# Patient Record
Sex: Female | Born: 1980 | Hispanic: No | Smoking: Former smoker
Health system: Southern US, Community
[De-identification: ages and names within clinical notes are randomized; demographics above are authoritative.]

## PROBLEM LIST (undated history)

## (undated) ENCOUNTER — Inpatient Hospital Stay (HOSPITAL_COMMUNITY): Payer: Self-pay

## (undated) DIAGNOSIS — F17210 Nicotine dependence, cigarettes, uncomplicated: Secondary | ICD-10-CM

## (undated) DIAGNOSIS — Z9889 Other specified postprocedural states: Secondary | ICD-10-CM

## (undated) DIAGNOSIS — F1990 Other psychoactive substance use, unspecified, uncomplicated: Secondary | ICD-10-CM

## (undated) DIAGNOSIS — E119 Type 2 diabetes mellitus without complications: Secondary | ICD-10-CM

## (undated) HISTORY — PX: OTHER SURGICAL HISTORY: SHX169

---
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
## 2002-01-13 ENCOUNTER — Ambulatory Visit (HOSPITAL_COMMUNITY): Admission: RE | Admit: 2002-01-13 | Discharge: 2002-01-13 | Payer: Self-pay | Admitting: *Deleted

## 2002-01-13 ENCOUNTER — Encounter: Payer: Self-pay | Admitting: *Deleted

## 2002-04-05 ENCOUNTER — Observation Stay (HOSPITAL_COMMUNITY): Admission: AD | Admit: 2002-04-05 | Discharge: 2002-04-06 | Payer: Self-pay | Admitting: *Deleted

## 2002-04-12 ENCOUNTER — Ambulatory Visit (HOSPITAL_COMMUNITY): Admission: AD | Admit: 2002-04-12 | Discharge: 2002-04-12 | Payer: Self-pay | Admitting: *Deleted

## 2002-04-18 ENCOUNTER — Inpatient Hospital Stay (HOSPITAL_COMMUNITY): Admission: AD | Admit: 2002-04-18 | Discharge: 2002-04-20 | Payer: Self-pay | Admitting: *Deleted

## 2003-01-03 ENCOUNTER — Emergency Department (HOSPITAL_COMMUNITY): Admission: EM | Admit: 2003-01-03 | Discharge: 2003-01-03 | Payer: Self-pay | Admitting: Internal Medicine

## 2003-01-17 ENCOUNTER — Ambulatory Visit (HOSPITAL_COMMUNITY): Admission: RE | Admit: 2003-01-17 | Discharge: 2003-01-17 | Payer: Self-pay | Admitting: *Deleted

## 2003-04-12 ENCOUNTER — Ambulatory Visit (HOSPITAL_COMMUNITY): Admission: AD | Admit: 2003-04-12 | Discharge: 2003-04-12 | Payer: Self-pay | Admitting: *Deleted

## 2003-04-14 ENCOUNTER — Inpatient Hospital Stay (HOSPITAL_COMMUNITY): Admission: RE | Admit: 2003-04-14 | Discharge: 2003-04-16 | Payer: Self-pay | Admitting: *Deleted

## 2003-04-25 ENCOUNTER — Emergency Department (HOSPITAL_COMMUNITY): Admission: EM | Admit: 2003-04-25 | Discharge: 2003-04-25 | Payer: Self-pay | Admitting: Emergency Medicine

## 2003-07-30 ENCOUNTER — Emergency Department (HOSPITAL_COMMUNITY): Admission: EM | Admit: 2003-07-30 | Discharge: 2003-07-31 | Payer: Self-pay | Admitting: Emergency Medicine

## 2003-08-15 ENCOUNTER — Emergency Department (HOSPITAL_COMMUNITY): Admission: EM | Admit: 2003-08-15 | Discharge: 2003-08-15 | Payer: Self-pay | Admitting: Emergency Medicine

## 2003-09-04 ENCOUNTER — Emergency Department (HOSPITAL_COMMUNITY): Admission: EM | Admit: 2003-09-04 | Discharge: 2003-09-04 | Payer: Self-pay | Admitting: Emergency Medicine

## 2003-10-31 ENCOUNTER — Ambulatory Visit (HOSPITAL_COMMUNITY): Admission: RE | Admit: 2003-10-31 | Discharge: 2003-10-31 | Payer: Self-pay | Admitting: Family Medicine

## 2003-11-08 ENCOUNTER — Ambulatory Visit (HOSPITAL_COMMUNITY): Admission: RE | Admit: 2003-11-08 | Discharge: 2003-11-08 | Payer: Self-pay | Admitting: Family Medicine

## 2004-02-09 ENCOUNTER — Ambulatory Visit (HOSPITAL_COMMUNITY): Admission: RE | Admit: 2004-02-09 | Discharge: 2004-02-09 | Payer: Self-pay | Admitting: Family Medicine

## 2004-03-24 ENCOUNTER — Emergency Department (HOSPITAL_COMMUNITY): Admission: EM | Admit: 2004-03-24 | Discharge: 2004-03-25 | Payer: Self-pay | Admitting: Emergency Medicine

## 2004-05-20 ENCOUNTER — Emergency Department (HOSPITAL_COMMUNITY): Admission: EM | Admit: 2004-05-20 | Discharge: 2004-05-20 | Payer: Self-pay | Admitting: Emergency Medicine

## 2004-11-19 ENCOUNTER — Emergency Department (HOSPITAL_COMMUNITY): Admission: EM | Admit: 2004-11-19 | Discharge: 2004-11-20 | Payer: Self-pay | Admitting: Emergency Medicine

## 2008-12-04 ENCOUNTER — Emergency Department (HOSPITAL_COMMUNITY): Admission: EM | Admit: 2008-12-04 | Discharge: 2008-12-04 | Payer: Self-pay | Admitting: Emergency Medicine

## 2009-05-15 ENCOUNTER — Emergency Department (HOSPITAL_COMMUNITY): Admission: EM | Admit: 2009-05-15 | Discharge: 2009-05-15 | Payer: Self-pay | Admitting: Emergency Medicine

## 2009-06-19 ENCOUNTER — Emergency Department (HOSPITAL_BASED_OUTPATIENT_CLINIC_OR_DEPARTMENT_OTHER): Admission: EM | Admit: 2009-06-19 | Discharge: 2009-06-19 | Payer: Self-pay | Admitting: Emergency Medicine

## 2009-10-03 ENCOUNTER — Emergency Department (HOSPITAL_COMMUNITY): Admission: EM | Admit: 2009-10-03 | Discharge: 2009-10-03 | Payer: Self-pay | Admitting: Emergency Medicine

## 2010-03-21 LAB — POCT URINALYSIS DIPSTICK
Bilirubin Urine: NEGATIVE
Glucose, UA: NEGATIVE mg/dL
Nitrite: NEGATIVE
Urobilinogen, UA: 0.2 mg/dL (ref 0.0–1.0)

## 2010-03-21 LAB — POCT PREGNANCY, URINE: Preg Test, Ur: NEGATIVE

## 2010-03-25 ENCOUNTER — Emergency Department (HOSPITAL_BASED_OUTPATIENT_CLINIC_OR_DEPARTMENT_OTHER)
Admission: EM | Admit: 2010-03-25 | Discharge: 2010-03-25 | Disposition: A | Discharge status: Home / Self Care | Payer: Self-pay | Attending: Emergency Medicine | Admitting: Emergency Medicine

## 2010-03-25 DIAGNOSIS — R51 Headache: Secondary | ICD-10-CM | POA: Insufficient documentation

## 2010-03-25 DIAGNOSIS — J020 Streptococcal pharyngitis: Secondary | ICD-10-CM | POA: Insufficient documentation

## 2010-03-25 LAB — URINALYSIS, ROUTINE W REFLEX MICROSCOPIC
Bilirubin Urine: NEGATIVE
Glucose, UA: NEGATIVE mg/dL
Hgb urine dipstick: NEGATIVE
Ketones, ur: NEGATIVE mg/dL
Nitrite: NEGATIVE
Protein, ur: NEGATIVE mg/dL
Specific Gravity, Urine: 1.03 (ref 1.005–1.030)
Urobilinogen, UA: 1 mg/dL (ref 0.0–1.0)
pH: 6 (ref 5.0–8.0)

## 2010-03-25 LAB — PREGNANCY, URINE: Preg Test, Ur: NEGATIVE

## 2010-03-26 LAB — POCT I-STAT, CHEM 8
BUN: 5 mg/dL — ABNORMAL LOW (ref 6–23)
Calcium, Ion: 1.15 mmol/L (ref 1.12–1.32)
Creatinine, Ser: 0.5 mg/dL (ref 0.4–1.2)
TCO2: 22 mmol/L (ref 0–100)

## 2010-03-26 LAB — CBC
Hemoglobin: 12.9 g/dL (ref 12.0–15.0)
MCHC: 34.9 g/dL (ref 30.0–36.0)
MCV: 92.3 fL (ref 78.0–100.0)
RBC: 4 MIL/uL (ref 3.87–5.11)
RDW: 12.8 % (ref 11.5–15.5)

## 2010-03-26 LAB — URINALYSIS, ROUTINE W REFLEX MICROSCOPIC
Bilirubin Urine: NEGATIVE
Hgb urine dipstick: NEGATIVE
Ketones, ur: NEGATIVE mg/dL
Specific Gravity, Urine: 1.017 (ref 1.005–1.030)
Urobilinogen, UA: 0.2 mg/dL (ref 0.0–1.0)

## 2010-03-26 LAB — DIFFERENTIAL
Basophils Relative: 1 % (ref 0–1)
Eosinophils Absolute: 0.1 10*3/uL (ref 0.0–0.7)
Monocytes Absolute: 0.4 10*3/uL (ref 0.1–1.0)
Monocytes Relative: 5 % (ref 3–12)
Neutrophils Relative %: 68 % (ref 43–77)

## 2010-04-10 LAB — DIFFERENTIAL
Eosinophils Absolute: 0.1 10*3/uL (ref 0.0–0.7)
Eosinophils Relative: 1 % (ref 0–5)
Lymphs Abs: 1.7 10*3/uL (ref 0.7–4.0)
Monocytes Relative: 8 % (ref 3–12)

## 2010-04-10 LAB — CBC
HCT: 35.3 % — ABNORMAL LOW (ref 36.0–46.0)
Hemoglobin: 12.1 g/dL (ref 12.0–15.0)
MCV: 91.9 fL (ref 78.0–100.0)
RBC: 3.85 MIL/uL — ABNORMAL LOW (ref 3.87–5.11)
WBC: 10.9 10*3/uL — ABNORMAL HIGH (ref 4.0–10.5)

## 2010-04-10 LAB — HEPATIC FUNCTION PANEL
Albumin: 3.3 g/dL — ABNORMAL LOW (ref 3.5–5.2)
Alkaline Phosphatase: 59 U/L (ref 39–117)
Indirect Bilirubin: 0 mg/dL — ABNORMAL LOW (ref 0.3–0.9)
Total Bilirubin: 0.2 mg/dL — ABNORMAL LOW (ref 0.3–1.2)

## 2010-05-24 NOTE — Discharge Summary (Signed)
   NAME:  [name], [name]                       ACCOUNT NO.:  [id number]   MEDICAL RECORD NO.:  [id number]                   PATIENT TYPE:  INP   LOCATION:  A418                                 FACILITY:  [facility]   PHYSICIAN:  Roylene Reason. Lisette Grinder, M.D.             DATE OF BIRTH:  [birth year]   DATE OF ADMISSION:  10/03/2001  DATE OF DISCHARGE:  10/05/2001                                 DISCHARGE SUMMARY   DIAGNOSIS:  Intrauterine pregnancy at 11 weeks with pyelonephritis.   DISPOSITION AT TIME OF DISCHARGE:  The patient is advised to be seen in the  office in one week's time.   DISCHARGE MEDICATIONS:  1. Keflex 500 mg p.o. b.i.d. x7 days.  2. Mepergan Fortis one to two q.6h. p.r.n. flank pain #20 with no refill.   DISCHARGE INSTRUCTIONS:  The patient is given instructions to contact us  immediately should nausea and vomiting occur such that she is unable to  tolerate her p.o. antibiotics.  Likewise, she is advised that she should  remain afebrile post hospitalization.   HOSPITAL COURSE:  I evaluated the patient on October 04, 2001 and agreed  with the diagnosis of pyelonephritis.  The patient was treated at that time  with IV fluid resuscitative therapy as well as Rocephin 1 g IV q.12h.  The  patient did well on this regimen.  She had a Tmax during the hospitalization  of 100.5.  She was able to ambulate and void without difficulty, tolerated a  regular general diet, and most importantly, was able to tolerate p.o.  narcotics for pain relief as well as p.o. antibiotics.  The patient did well  during this hospitalization such that she was discharged to home on  October 05, 2001.  Pertinently at time of discharge she continues to  complain of some back pain; however, the patient's complaints at this time  are not that of flank pain, rather of sacroiliac joint pain bilaterally.  This should respond to improved posture as well as short-time use of pain  medication.  Thus the patient  was discharged home on October 05, 2001.  She will continue to follow up and been seen for prenatal care.   OTHER PROCEDURES:  An OB ultrasound was performed in the department of  radiology which confirmed a viable pregnancy and agreed with our prior  gestational age calculations.                                               Donald P. Lisette Grinder, M.D.    DPC/MEDQ  D:  10/06/2001  T:  10/07/2001  Job:  [id number]

## 2010-05-24 NOTE — H&P (Signed)
NAME:  [name], [name]                       ACCOUNT NO.:  [id number]   MEDICAL RECORD NO.:  [id number]                   PATIENT TYPE:  INP   LOCATION:  LDR1                                 FACILITY:  [facility]   PHYSICIAN:  Langley Gauss, M.D.                DATE OF BIRTH:  02/28/1980   DATE OF ADMISSION:  04/18/2002  DATE OF DISCHARGE:                                HISTORY & PHYSICAL   HISTORY OF PRESENT ILLNESS:  This 30 year old gravida 2, para 1, 38+ weeks  gestation, is admitted for induction of labor secondary to psychosocial  factors.  The patient's prenatal course has been complicated only by  admission for a pyelonephritis from 09/28 to 10/05/2001.  The patient,  thereafter, was to be maintained on Macrodantin suppressive therapy 100 mg  p.o. q.h.s.; however, she was noncompliant with this, stating that she  preferred not to take medication.  Fortunately she did not have any  recurrent UTIs or kidney infections during the remainder of the pregnancy.  The patient has had normal laboratory studies, glucose tolerance test is  normal at 121.  She has had normal ultrasounds which have documented  adequate fetal growth and normal anatomic survey.   ALLERGIES:  She has no known drug allergies.   CURRENT MEDICATIONS:  Prenatal vitamins.   PAST OBSTETRICAL HISTORY:  11/04/1998 a vaginal delivery at [redacted] weeks  gestation, meconium stained amniotic fluid an 8 pound 10.4 ounce female  infant.   SOCIAL HISTORY:  The patient was smoking prior to the pregnancy, planned on  quitting during the pregnancy.  She is unemployed.  The father of the infant  is named Laurette Schimke who is self-employed.   PHYSICAL EXAMINATION:  GENERAL:  No acute distress.  VITAL SIGNS:  Height 5 feet 3 inches.  Prepregnancy weight 160, most recent  weight 176.  Blood pressure 106/64, pulse of 80, and respiratory rate is 20.  HEENT:  Negative.  NECK:  No adenopathy.  Neck is supple.  Thyroid is not palpable.  LUNGS:  Clear.  CARDIOVASCULAR:  Regular rate and rhythm.  ABDOMEN:  The abdomen is soft and nontender.  No surgical scars are  identified.  She is vertex presentation by Leopold's maneuver.  EXTREMITIES:  Extremities noted to be normal with only 1+ pretibial edema.  PELVIC:  Normal external genitalia.  No lesions or ulcerations identified.  Cervix, digital examination:  Cervix is 2 cm dilated 70% effaced vertex at a  minus 1 station easily palpable.  External fetal monitoring reveals a  reassuring fetal heart rate.  No uterine contractions are identified  initially.   PLAN:  The patient admitted, at this time, for induction of labor.  Initially Pitocin induction is initiated upon reach an adequate functional  labor pattern amniotomy was performed with findings of clear amniotic fluid.  A fetal scalp electrode placed which documents a reassuring fetal heart  rate.  With continuing strength and  duration of uterine  contractions the patient requests an epidural which is to be placed for  analgesic purposes. Notably the patient has had epidural placed during her  previous labor and delivery at which time she did very well with an  excellent bilateral block.                                               Langley Gauss, M.D.    DC/MEDQ  D:  04/18/2002  T:  04/18/2002  Job:  [id number]

## 2010-05-24 NOTE — Op Note (Signed)
NAME:  [name], [name]                       ACCOUNT NO.:  [id number]   MEDICAL RECORD NO.:  [id number]                   PATIENT TYPE:  INP   LOCATION:  LDR1                                 FACILITY:  [facility]   PHYSICIAN:  Langley Gauss, M.D.                DATE OF BIRTH:  01-22-1980   DATE OF PROCEDURE:  04/18/2002  DATE OF DISCHARGE:                                 OPERATIVE REPORT   PREOPERATIVE DIAGNOSES:  A 38 week intrauterine pregnancy for induction.   POSTOPERATIVE DIAGNOSES:  A 38 week intrauterine pregnancy for induction.   PROCEDURE:  1. Placement of epidural.  2. Spontaneous cystovaginal delivery of 6 pound, 14 ounce female infant.  3. Midline episiotomy repair, delivery by Dr. Roylene Reason. Lisette Grinder.   ESTIMATED BLOOD LOSS:  Less than 500 mL.   FINDINGS:  Nuchal cord x1 without compression, reduced at time of delivery.   SPECIMENS:  Arterial cord gas and cord blood to pathology.   LABORATORY DATA:  Placenta was examined and noted to be apparently intact  with a three vessel umbilical cord.   SUMMARY:  The patient had an epidural placed in the afternoon at which time  she was examined thereafter and noted to be 6 cm. The patient had reached 8  cm dilatation with a reassuring fetal heart rate at which time she began  having significant pain associated with uterine contractions thus she was  treated with a bolus of 10 mL of 2% lidocaine. This provided minimal relief  and was about one hour duration after which time the patient became  completely dilated. She was on the labor bed with her legs flexed at the hip  at which time she pushed well and dissented the vertex to the perineal  floor. A small midline episiotomy was performed with distention of the  perineum and infant then delivered in a direct OA position. Mouth and nares  bulb suctioned of clear amniotic fluid. A nuchal cord x1 was reduced.  Spontaneous rotation occurred to a left anterior shoulder position,  gentle  abdominal traction combined with expulsive efforts resulted in delivery of  the remainder of the infant without difficulty, umbilical cord is milked  towards the infant. The cord was doubly clamped and cut and infant is handed  to the nursing staff for immediate assessment. Arterial cord gas and cord  blood then obtained. Gentle traction of the umbilical cord results in  separation which upon examination appears to be an intact placenta with  associated three vessel umbilical cord. Examination of the perineum reveals  no extensions of the episiotomy. The episiotomy was repaired utilizing 2-0  Vicryl in deep interrupted fashion in the superficial, transverse and  perineal muscle followed by #0 Chromic in a running locked fashion on the  vaginal mucosa, followed by two layer closure of #0 Chromic in the perineal  body.  The patient tolerated the delivery very well. She was  taken out of dorsal  lithotomy position and rolled to her side at which time the epidural  catheter was removed with the blue tip noted to be intact. Both mother and  infant doing well following delivery.                                                Langley Gauss, M.D.    DC/MEDQ  D:  04/18/2002  T:  04/18/2002  Job:  [id number]

## 2010-05-24 NOTE — Op Note (Signed)
   NAME:  [name], [name]                       ACCOUNT NO.:  [id number]   MEDICAL RECORD NO.:  [id number]                   PATIENT TYPE:  INP   LOCATION:  LDR1                                 FACILITY:  [facility]   PHYSICIAN:  Langley Gauss, M.D.                DATE OF BIRTH:  1980/11/29   DATE OF PROCEDURE:  04/18/2002  DATE OF DISCHARGE:                                 OPERATIVE REPORT   PROCEDURE:  Placement of continuous lumbar epidural analgesia at the L3-L4  interspace.   SURGEON:  Langley Gauss, M.D.   COMPLICATIONS:  None.   DESCRIPTION OF PROCEDURE:  Appropriate informed consent was obtained.  The  patient was placed in the seated position.  Bony landmarks were identified.  The L3-L4 interspace was chosen.  The patient's back was thoroughly prepped  and draped in the usual manner.  Lidocaine 5 cc 1% plain injected at the  midline of the L3-L4 interspace to raise a small skin wheal.  The 17-gauge  Tuohy Schliff needle was then utilized with loss-of-resistance and an air  filled glass syringe to identify entry into the epidural space on the first  attempt without difficulty.  Initial test dose of 5 cc 1.5% lidocaine plus  epinephrine injected through the epidural needle.  No signs of CSF or  intravascular injection obtained.  The epidural catheter was then inserted  through the upper 5 cm.  Epidural needle was removed.  Aspiration test was  negative.  Second test dose of 2 cc 1.5% lidocaine plus epinephrine injected  through the epidural catheter. Again no signs of CSF or intravascular  injection obtained.  The catheter was secured in place.  Instruments are  removed.  The patient is connected to the infusion pump containing our  standard mixture.  She will be treated with a bolus of 10 cc followed by  continuous infusion rate of 14 cc per hour.  Following completion of the  procedure, the patient does have evidence of a bilateral setting up epidural  block.  She was thus  connected to the infusion pump.  Examination reveals  cervix to be 6 cm dilated, completely effaced with a vertex at 0 station.  Contractions continue every 3-6 minutes.                                               Langley Gauss, M.D.    DC/MEDQ  D:  04/18/2002  T:  04/18/2002  Job:  [id number]   cc:   [facility]

## 2010-05-24 NOTE — Discharge Summary (Signed)
NAMECORALEE, [name] NO.:  [id number]   MEDICAL RECORD NO.:  [id number]                  PATIENT TYPE:  PEMS   LOCATION:                                       FACILITY:   PHYSICIAN:  Langley Gauss, M.D.                DATE OF BIRTH:   DATE OF ADMISSION:  DATE OF DISCHARGE:                                 DISCHARGE SUMMARY   EMERGENCY ROOM VISIT:  The patient is a 30 year old, 2 weeks postpartum who  comes in the emergency room complaining of passage of a large clot about  2030 this p.m.  The patient states that she has had light vaginal bleeding  sine delivery with passage of very small clots. She has had cramping which  has been well-controlled with p.o. Tylox and p.o. Motrin.  However, she ran  out of these and took her last one Sunday p.m.  Since that point in time she  has had increased cramping and passage of that clot.  Following passage of  that clot, the patient states that she continued to have some bright red  vaginal bleeding for which she came to the emergency room.  However, since  that point in time, she has had markedly decreased cramping and markedly  decreased bleeding.  She denies any additional passage of clots.  The  patient denies any fever although she has not taken her temperature at home.  The labor and delivery as well as immediate postpartum course in the  hospital were uncomplicated.  She is bottle feeding at this time.   PAST MEDICAL HISTORY:  Two prior vaginal deliveries.   ALLERGIES:  No known drug allergies.   CURRENT MEDICATIONS:  Motrin p.o.   PHYSICAL EXAMINATION:  GENERAL:  In no acute distress.  VITAL SIGNS:  99.1, 107/63, 89, respiratory rate is 18.  ABDOMEN:  Soft, nontender.  No masses are appreciated.  PELVIC EXAMINATION:  Normal external genitalia no vaginal bleeding is noted  on the external genitalia.  A sterile speculum examination is performed.  A  very light amount of bleeding is noted from the  endocervical os.  No  cervical lesions are identified.  No clots are identified in the vaginal  vault.  The cervix is noted to be dilated sufficiently enough to allow  passage of a large Q-tip.  A ring forceps is then gently introduced through  the endocervical os.  No additional clots or products of conception are  obtained.  This is poorly tolerated the patient thus complete exploration of  the uterine cavity is not possible.  Bimanual examination reveals a soft 8-  week size uterus mildly tender.  No adnexal masses.  A CBC with differential  is currently pending.   ASSESSMENT:  Mild endometritis, postpartum.  Passage of one clot, now with  subinvolution of the uterus.   PLAN:  Treat the patient as an outpatient.  She is advised that  she must  follow up in the office the following day for performance of an ultrasound  to evaluate for retained products, in addition we will be able to evaluate  her bleeding.  She is treated in the emergency room with Methergine 0.2 mg  IM, doxycycline 100 mg p.o. x1, Rocephin 250 mg IM x1.  In addition she is  given #6 of Tylox to take with her as outpatient treatment.     ___________________________________________                                         Langley Gauss, M.D.   DC/MEDQ  D:  04/25/2003  T:  04/26/2003  Job:  [id number]

## 2010-05-24 NOTE — Op Note (Signed)
NAME:  [name], [name]                       ACCOUNT NO.:  [id number]   MEDICAL RECORD NO.:  [id number]                   PATIENT TYPE:  INP   LOCATION:  A410                                 FACILITY:  [facility]   PHYSICIAN:  Langley Gauss, M.D.                DATE OF BIRTH:  03/08/1980   DATE OF PROCEDURE:  DATE OF DISCHARGE:                                 OPERATIVE REPORT   PROCEDURE:  Placement of continuous lumbar epidural analgesia at the L2-L3  interspace performed by Dr. Roylene Reason. Lisette Grinder.   COMPLICATIONS:  None.   DESCRIPTION OF PROCEDURE:  An appropriate and informed consent is obtained.  Continuous electronic fetal monitoring is performed.  The patient was placed  in the seated position at which time bony landmarks were identified.  The L2-  L3 interspace was chosen.  The patient's back is sterilely prepped and  draped utilizing the epidural tray; 3 cc of 1% lidocaine plain injected at  the midline of the L2-L3 interspace to raise this site to raise a small skin  wheal.   The 17-gauge Touhy-Schliff needle was then utilized with loss of resistance  in air-filled glass syringe to identify entry into the epidural space on the  first attempt without difficulty.  Initial test dose of 5 cc of 1.5%  lidocaine plus epinephrine injected through the epidural needle.  No signs  of CSF or intravascular injection obtained.  Thus, the epidural catheter was  inserted to a depth of 35 cm.  Epidural needle is removed.  Aspiration test  is negative.  Second test dose of 2 cc of 1.5% lidocaine plus epinephrine  injected through epidural catheter; again, no signs of CSF or intravascular  injection obtained.   The patient is having some tingling consistent with a proper setting up  epidural block.  Thus, she is connected to the infusion pump containing the  standard mixture.  She will be treated with a bolus of 10 cc followed by  continuous infusion rate of 14 cc/hour.  Upon completion of the  procedure  the patient is returned to the left lateral decubitus position.  The cervix  is examined.  She is now noted to be 5 cm dilated. She continues to have a  reassuring fetal heart rate.  She does have evidence of an obvious  adequately setting up bilateral block.      ___________________________________________                                            Langley Gauss, M.D.   DC/MEDQ  D:  04/14/2003  T:  04/15/2003  Job:  [id number]

## 2010-05-24 NOTE — H&P (Signed)
NAME:  [name], [name]                       ACCOUNT NO.:  [id number]   MEDICAL RECORD NO.:  [id number]                   PATIENT TYPE:  INP   LOCATION:  A410                                 FACILITY:  [facility]   PHYSICIAN:  Langley Gauss, M.D.                DATE OF BIRTH:  [birth year]   DATE OF ADMISSION:  04/14/2003  DATE OF DISCHARGE:                                HISTORY & PHYSICAL   HISTORY OF PRESENT ILLNESS:  The patient is a 30 year old gravida 3, para 2,  two prior vaginal deliveries at 37+ weeks gestation who presents in the  early stages of active labor.  The patient's prenatal course had been  uncomplicated.  She, however, does have an unknown group B strep status as  it was performed in the office on April 13, 2003, with results not available.  The patient has had serial ultrasounds which have documented a normal  anatomic survey and adequate fetal growth.   PAST OB HISTORY:  She has two prior vaginal deliveries without  complications.   CURRENT MEDICATIONS:  Hydrocodone on a p.r.n. basis.   ALLERGIES:  No known drug allergies.   PHYSICAL EXAMINATION:  GENERAL:  A pleasant female, fluent with [place] as  well as [place].  VITAL SIGNS:  Temperature 97, respiratory rate 20, pulse 86, blood pressure  117/68.  Height 5 feet, 5 inches.  Current weight 166.  HEENT:  Negative.  No adenopathy.  NECK:  Supple.  Thyroid is nonpalpable.  LUNGS:  Clear.  CARDIOVASCULAR:  Exam is regular rate and rhythm.  ABDOMEN:  Soft, nontender.  No surgical scars were identified.  She has  vertex presentation, by Thayer Ohm maneuver.  Fundal height is measured at 34  cm.  EXTREMITIES:  Noted to be normal.  PELVIC:  Normal external genitalia.  No lesions or ulcerations identified.  No vaginal bleeding, no leakage of fluid.  Cervical examination, 4 cm  dilated, 70% effaced, posterior position, but the vertex is at 0 station.  External fetal monitor reveals a reassuring fetal heart rate.   Initial  uterine activity q.3-5 minutes on external fetal monitor.  The patient did  have decrease in uterine activity to q.5-10 minutes after receiving IV  narcotics for pain relief.   ASSESSMENT/PLAN:  1. The patient with unknown group B strep status.  We will thus treat with     IV ampicillin during the course of labor.  Subsequent plans include     proceeding with amniotomy.  The patient to be managed expectantly.     Follow     amniotomy.  If active labor does not ensue, we will proceed with Pitocin     induction or augmentation as clinically indicated.  The patient is     planning on having epidural analgesic placed during this labor.  She has     had epidurals placed with prior deliveries.    ___________________________________________  Langley Gauss, M.D.   DC/MEDQ  D:  04/14/2003  T:  04/14/2003  Job:  [id number]

## 2010-05-24 NOTE — Discharge Summary (Signed)
NAME:  [name], [name]                       ACCOUNT NO.:  [id number]   MEDICAL RECORD NO.:  [id number]                   PATIENT TYPE:  INP   LOCATION:  A410                                 FACILITY:  [facility]   PHYSICIAN:  Langley Gauss, M.D.                DATE OF BIRTH:  Oct 05, 1980   DATE OF ADMISSION:  04/14/2003  DATE OF DISCHARGE:  04/16/2003                                 DISCHARGE SUMMARY   DISCHARGE DIAGNOSIS:  See previous dictations.   PROCEDURE:  Vaginal delivery performed utilizing epidural analgesic.   DISPOSITION:  The patient is bottle-feeding at the time of discharge.   FOLLOW UP:  She will have followup in the office in four weeks' time.   SPECIAL INSTRUCTIONS:  She is given a copy of the standard discharge  instructions.   DISCHARGE MEDICATIONS:  1. HCTZ 25 mg p.o. q.d.  2. Tylox 30 mg without refill.  3. Ambien 10 mg p.o. q.h.s., #10 with no refill.   LABORATORY DATA AND X-RAY FINDINGS:  Admission hemoglobin and hematocrit  10.8 and 390.8 with a white count of 6.6.  Post partum day #1, hemoglobin  and hematocrit 9.4 and 27.7 with white count of 6.3.  ER/PR is noted to be  nonreactive.  GBS status is unknown as GBS culture had been performed in the  office on April 13, 2003, and the results not yet made available.  Thus, the  patient was treated empirically during the course of labor according to the  [facility] protocol.   HOSPITAL COURSE:  See previous dictations.  The patient was admitted on  April 14, 2003, actually presenting in labor.  The patient initially  requested IV narcotics for pain relief.  However, with increasing discomfort  associated with uterine contractions, she requested epidural as had been  expected.  Epidural was placed without complications or difficulty.  Epidural actually functioned very well given an excellent bilateral block.  With onset of the adequate block, the patient actually progressed rapidly  along the labor curve to  complete dilatation.  She delivered without  complications.  Post partum, the patient did well.  She had no postpartum  complications and bonded well with the infant.  She had no excessive  bleeding.  Thus, the mother and infant were discharged home on April 16, 2003.  The patient's circumcision had been performed the p.m. of April 15, 2003, by Dr. Langley Gauss.  See separate dictation.     ___________________________________________                                         Langley Gauss, M.D.   DC/MEDQ  D:  04/17/2003  T:  04/17/2003  Job:  [id number]

## 2010-05-24 NOTE — H&P (Signed)
NAME:  [name], [name]                       ACCOUNT NO.:  [id number]   MEDICAL RECORD NO.:  [id number]                   PATIENT TYPE:  INP   LOCATION:  A418                                 FACILITY:  [facility]   PHYSICIAN:  Roylene Reason. Lisette Grinder, M.D.             DATE OF BIRTH:  02-05-1980   DATE OF ADMISSION:  10/03/2001  DATE OF DISCHARGE:  10/05/2001                                HISTORY & PHYSICAL   HISTORY OF PRESENT ILLNESS:  A 30 year old gravida 2, para 1 at about [redacted]  weeks gestation who actually presented to [facility] in  the emergency room the late p.m. of October 03, 2001, at which time verbal  orders were given; however, the patient was not evaluated and admitted until  the a.m. of October 04, 2001 by myself.  The patient presented to the  emergency room complaining of flank pain in the right for several days'  duration.  In addition, she has had some nausea, as well as some sweats.  She likewise has noticed some urinary frequency and dysuria.  The patient  evaluated in the emergency room on October 03, 2001 by the emergency room  physician.  Thereafter, Dr. Duane Lope was contacted, according to cross-  coverage arrangement.  He gave verbal orders for the patient to be admitted  but I did not see and evaluate the patient until the a.m. of October 04, 2001.   PRENATAL COURSE:  The patient had an initial prenatal visit on September 14, 2001, at which time she complained only of breast tenderness, no spotting or  cramping, and some nausea.   CURRENT MEDICATIONS:  Prenatal vitamins.   ALLERGIES:  No known drug allergies.   OBSTETRICAL HISTORY:  November 04, 1998:  Vaginal delivery, 8 pound 10.4  ounce infant utilizing epidural.  The patient with no other medical or  surgical history.   PHYSICAL EXAMINATION:  GENERAL:  A [place] female in mild distress.  VITAL SIGNS:  T-max 100.5, blood pressure 115/70, pulse rate of 80,  respiratory rate is  20.  HEENT:  Negative.  No adenopathy.  NECK:  Supple.  ABDOMEN:  A gravid uterus consistent with [redacted] weeks gestation.  BREASTS:  Without masses.  EXTREMITIES:  Noted to be normal.  PELVIC:  Normal external genitalia.  No bleeding or leakage of fluid is  identified.  There is noted to be a marked right CVAT on examination.   LABORATORY DATA:  Urine culture is pertinent for the presence of white  cells, as well as bacteria.  Hemoglobin 11.6, hematocrit 33.6, white count  7.3.    ASSESSMENT:  An 11-week intrauterine pregnancy with right flank pain, a low-  grade temperature, and urinary tract symptoms.  Likewise, laboratory studies  support a diagnosis of pyelonephritis, thus the patient is admitted by  myself on October 04, 2001 for a fluid resuscitative therapy, as well as  intravenous  antibiotics.  Obstetric ultrasound to be performed on this date  to confirm gestational age and assess viability of the infant.                                               Donald P. Lisette Grinder, M.D.    DPC/MEDQ  D:  10/06/2001  T:  10/06/2001  Job:  [id number]

## 2010-05-24 NOTE — Op Note (Signed)
NAME:  [name], [name]                       ACCOUNT NO.:  [id number]   MEDICAL RECORD NO.:  [id number]                   PATIENT TYPE:  INP   LOCATION:  A410                                 FACILITY:  [facility]   PHYSICIAN:  Langley Gauss, M.D.                DATE OF BIRTH:  1980/03/21   DATE OF PROCEDURE:  DATE OF DISCHARGE:                                 OPERATIVE REPORT   DELIVERY NOTE   DATE OF DELIVERY:  April 14, 2003   DIAGNOSES:  1. A 37+ weeks intrauterine pregnancy presenting in labor.  2. Unknown group B strep status.   PROCEDURES:  1. Placement of continuous lumbar epidural analgesia.  2. Spontaneous assisted vaginal delivery of a 6 pound 11 ounce female infant     delivered over an intact perineum.  Delivery performed by Dr. Roylene Reason.     Lisette Grinder.   ESTIMATED BLOOD LOSS:  Less than 500 cc.   COMPLICATIONS:  None.   SPECIMENS:  1. Arterial cord gas and cord blood to laboratory.  2. Placenta examined and noted to be apparently intake with a 3-vessel     umbilical cord.   SUMMARY:  A 30 year old patient at 37+ weeks intrauterine pregnancy who  presented in the early a.m. in the early stages of labor.  She was  complaining of contractions q.3-5 minutes throughout the night. No  complaints of vaginal discharge or no vaginal bleeding.   The patient was admitted and initially was treated with IV Nubain and  Phenergan for pain relief.  However, with onset of active labor the patient  requested epidural analgesic.  Epidural was placed without difficulty and  functioned well throughout the remainder of the labor course.   The patient progressed rapidly along the labor curve to complete dilatation.  At complete dilatation additional analgesic was required.  The patient did  received 5 cc of 2% lidocaine plain to facilitate the epidural block.   The patient was placed in the dorsal lithotomy position and prepped and  draped in the usual sterile manner.  Foley catheter  was removed.  The  patient pushed well during a short second stage of labor with descent of the  vertex to the perineal floor.  The perineum was milked and gently stretched  during the second stage.  The infant, then delivered, in a direct OA  position over the intact perineum.  The mouth and nares were bulb suctioned  of clear amniotic fluid.  Spontaneous rotation then occurred to a left  anterior shoulder position.  Gentle downward traction combined with  expulsive efforts resulted in delivery of the remainder of the infant  without difficulty.  A spontaneous cry is noted.  The umbilical cord is  milked towards the infant.  The cord is doubly clamped, and cut; and infant  is taken to the nursery staff for immediate assessment.  Arterial cord gases  and cord blood are then  obtained.   Gentle traction on the umbilical cord results in separation which, upon  examination, appears to be an intact placenta with associated 3-vessel  umbilical cord.  Fundal massage is required to achieve adequate uterine tone  and minimize bleeding.  Following no complications were noted to have  occurred.  The patient is thus taken out of dorsal lithotomy position and  rolled to her side, at which time the epidural catheter is removed with the  blue tip noted to be intact.      ___________________________________________                                            Langley Gauss, M.D.   DC/MEDQ  D:  04/14/2003  T:  04/15/2003  Job:  [id number]

## 2010-05-24 NOTE — Discharge Summary (Signed)
   NAME:  [name], [name]                       ACCOUNT NO.:  [id number]   MEDICAL RECORD NO.:  [id number]                   PATIENT TYPE:  INP   LOCATION:  A427                                 FACILITY:  [facility]   PHYSICIAN:  Langley Gauss, M.D.                DATE OF BIRTH:  07-Jan-1980   DATE OF ADMISSION:  04/18/2002  DATE OF DISCHARGE:  04/20/2002                                 DISCHARGE SUMMARY   DIAGNOSES:  A 38-week intrauterine pregnancy for induction of labor.   PROCEDURE:  1. Placement of epidural.  2. Spontaneous assisted vaginal delivery 6 pound 14 ounce female infant.  3. Midline episiotomy repair.   DISPOSITION:  Given a copy of standardized discharge instructions.  Follow  up in the office in four weeks' time.  She is bottle feeding at time of  discharge.  At postpartum follow-up she would like to initiate the NuvaRing  for contraceptive purposes.   LABORATORIES:  Admission hemoglobin and hematocrit 11.3/33.5 with a white  count of 8.0.  Postpartum day number one 10.5/30.8 with a white count of  9.4.  O+ blood type.   HOSPITAL COURSE:  See previous dictations.  The patient delivered in a well  controlled manner with an excellent epidural block.  Postpartum patient did  well.  She had no postpartum complications.  Bonded well with the infant.  Had good family support.  Was thus discharged to home on postpartum day  number one and one-half.   DISCHARGE MEDICATIONS:  1. Tylenol No.3 for relief associated with the suturing.  2. To take Advil or Motrin on a p.r.n. basis.                                               Langley Gauss, M.D.    DC/MEDQ  D:  04/20/2002  T:  04/20/2002  Job:  [id number]

## 2010-05-24 NOTE — Discharge Summary (Signed)
NAME:  [name], [name]                        ACCOUNT NO.:  [id number]   MEDICAL RECORD NO.:  [id number]                   PATIENT TYPE:  INP   LOCATION:  A410                                 FACILITY:  [facility]   PHYSICIAN:  Langley Gauss, M.D.                DATE OF BIRTH:  07-21-1980   DATE OF ADMISSION:  04/14/2003  DATE OF DISCHARGE:  04/16/2003                                 DISCHARGE SUMMARY   PROCEDURES:  Infant circumcision utilizing Mogen clamp, performed by Dr.  Roylene Reason. Lisette Grinder, without complications per parent's request.   SUMMARY:  The infant was taken to the nursery, placed on the circumcision  table under restraints, prepped and draped in the usual sterile manner.  Dorsal penile nerve block was then placed by utilizing 0.2 cc 1% lidocaine  plain at the dorsal surface at the base of the penis.  The ureteral meatus  was then identified.  Foreskin was grasped at 3 and 9 o'clock with straight  hemostat clamps.  This then allowed me to dissect in the avascular plane  between 9 and 3 o'clock and free up the foreskin.  The tip of the head of  the penis was then visualized.  A straight hemostat clamp was used to grasp  and crush the foreskin at 12 o'clock along the lying access of the penis.  This was then snipped with a scissors which allowed better visualization of  the tip of the head of the penis.  Under direct visualization, the tip of  the head of his penis was identified.  A straight hemostat was used to clamp  redundant foreskin just distal to this.  The Mogen clamp was then placed  just proximal to this.  Sharp excision was performed between the two.  Mogen  clamp was then removed.  Foreskin was retracted proximally over the head of  the penis resulting in the excellent cosmetic result.  Single piece of  Surgicel woven cloth was then placed circumferentially at the operative  site.  Infant tolerated the procedure very well and was returned to the  mother in excellent  condition.     ___________________________________________                                         Langley Gauss, M.D.   DC/MEDQ  D:  04/17/2003  T:  04/17/2003  Job:  [id number]

## 2011-12-08 ENCOUNTER — Encounter (HOSPITAL_COMMUNITY): Payer: Self-pay | Admitting: Nurse Practitioner

## 2011-12-08 ENCOUNTER — Emergency Department (HOSPITAL_COMMUNITY)
Admission: EM | Admit: 2011-12-08 | Discharge: 2011-12-08 | Disposition: A | Discharge status: Home / Self Care | Payer: Self-pay | Attending: Emergency Medicine | Admitting: Emergency Medicine

## 2011-12-08 ENCOUNTER — Emergency Department (HOSPITAL_COMMUNITY): Payer: Self-pay

## 2011-12-08 DIAGNOSIS — O26899 Other specified pregnancy related conditions, unspecified trimester: Secondary | ICD-10-CM

## 2011-12-08 DIAGNOSIS — O99891 Other specified diseases and conditions complicating pregnancy: Secondary | ICD-10-CM | POA: Insufficient documentation

## 2011-12-08 DIAGNOSIS — N949 Unspecified condition associated with female genital organs and menstrual cycle: Secondary | ICD-10-CM | POA: Insufficient documentation

## 2011-12-08 DIAGNOSIS — R109 Unspecified abdominal pain: Secondary | ICD-10-CM | POA: Insufficient documentation

## 2011-12-08 LAB — URINALYSIS, ROUTINE W REFLEX MICROSCOPIC
Glucose, UA: NEGATIVE mg/dL
Ketones, ur: NEGATIVE mg/dL
Leukocytes, UA: NEGATIVE
Protein, ur: NEGATIVE mg/dL

## 2011-12-08 LAB — COMPREHENSIVE METABOLIC PANEL
ALT: 9 U/L (ref 0–35)
Albumin: 3 g/dL — ABNORMAL LOW (ref 3.5–5.2)
Calcium: 8.5 mg/dL (ref 8.4–10.5)
GFR calc Af Amer: >90 mL/min (ref >90)
Glucose, Bld: 86 mg/dL (ref 70–99)
Sodium: 135 mEq/L (ref 135–145)
Total Protein: 6.8 g/dL (ref 6.0–8.3)

## 2011-12-08 LAB — WET PREP, GENITAL: Trich, Wet Prep: NONE SEEN

## 2011-12-08 LAB — CBC
HCT: 34.1 % — ABNORMAL LOW (ref 36.0–46.0)
Hemoglobin: 11.2 g/dL — ABNORMAL LOW (ref 12.0–15.0)
MCV: 88.1 fL (ref 78.0–100.0)
WBC: 7 10*3/uL (ref 4.0–10.5)

## 2011-12-08 MED ORDER — GI COCKTAIL ~~LOC~~: 30.0000 mL | Freq: Once | ORAL | Status: DC

## 2011-12-08 NOTE — ED Provider Notes (Signed)
History     CSN: [id number]  Arrival date & time 12/08/11  1218   First MD Initiated Contact with Patient 12/08/11 1733      Chief Complaint  Patient presents with  . Pelvic Pain    (Consider location/radiation/quality/duration/timing/severity/associated sxs/prior treatment) HPI [name] is a 31 y.o. female who presents with complaint of pelvic and vaginal pain for 2 wks. States she found out she was pregnant in Sept of this year, about 3 months ago. States last period was end of august. States on Oct 4, she took morning after pill. States she had cramping and bleeding for a week after this, states then symptoms resolved until about 2 wks ago. Denies bleeding or discharge, states only pain. Denies hx of the same. Pt is a G4P3 with 3 vaginal deliveries with no complications. She denies fever, chills, malaise, urinary symptoms.    No past medical history on file.  No past surgical history on file.  No family history on file.  History  Substance Use Topics  . Smoking status: Never Smoker   . Smokeless tobacco: Not on file  . Alcohol Use: Yes    OB History    Grav Para Term Preterm Abortions TAB SAB Ect Mult Living                  Review of Systems  Gastrointestinal: Positive for abdominal pain.  Genitourinary: Positive for vaginal pain and pelvic pain.  All other systems reviewed and are negative.    Allergies  Review of patient's allergies indicates no known allergies.  Home Medications  No current outpatient prescriptions on file.  BP 128/63  Pulse 81  Temp 98.3 F (36.8 C) (Oral)  Resp 16  SpO2 97%  LMP 09/01/2011  Physical Exam  Nursing note and vitals reviewed. Constitutional: She appears well-developed and well-nourished. No distress.  HENT:  Head: Normocephalic.  Eyes: Conjunctivae normal are normal.  Neck: Neck supple.  Cardiovascular: Normal rate, regular rhythm and normal heart sounds.   Pulmonary/Chest: Effort normal and breath  sounds normal. No respiratory distress. She has no wheezes. She has no rales.  Abdominal: Soft. Bowel sounds are normal. There is no tenderness. There is no rebound and no guarding.  Genitourinary:       Normal external genitalia. Cervix bluish in tint, closed. Uterus enlarged, tender. White thin vaginal discharge  Neurological: She is alert.  Skin: Skin is warm and dry.    ED Course  Procedures (including critical care time)  Pt with positive pregnancy test, last period end of august, possibly 4 months pregnant. No vaginal bleeding or discharge. Superapubic/uterine tenerness. Will get [place].  Results for orders placed during the hospital encounter of 12/08/11  URINALYSIS, ROUTINE W REFLEX MICROSCOPIC      Component Value Range   Color, Urine YELLOW  YELLOW   APPearance HAZY (*) CLEAR   Specific Gravity, Urine 1.015  1.005 - 1.030   pH 7.5  5.0 - 8.0   Glucose, UA NEGATIVE  NEGATIVE mg/dL   Hgb urine dipstick NEGATIVE  NEGATIVE   Bilirubin Urine NEGATIVE  NEGATIVE   Ketones, ur NEGATIVE  NEGATIVE mg/dL   Protein, ur NEGATIVE  NEGATIVE mg/dL   Urobilinogen, UA 1.0  0.0 - 1.0 mg/dL   Nitrite NEGATIVE  NEGATIVE   Leukocytes, UA NEGATIVE  NEGATIVE  COMPREHENSIVE METABOLIC PANEL      Component Value Range   Sodium 135  135 - 145 mEq/L   Potassium 3.6  3.5 -  5.1 mEq/L   Chloride 102  96 - 112 mEq/L   CO2 23  19 - 32 mEq/L   Glucose, Bld 86  70 - 99 mg/dL   BUN 8  6 - 23 mg/dL   Creatinine, Ser 4.09 (*) 0.50 - 1.10 mg/dL   Calcium 8.5  8.4 - 81.1 mg/dL   Total Protein 6.8  6.0 - 8.3 g/dL   Albumin 3.0 (*) 3.5 - 5.2 g/dL   AST 9  0 - 37 U/L   ALT 9  0 - 35 U/L   Alkaline Phosphatase 61  39 - 117 U/L   Total Bilirubin 0.2 (*) 0.3 - 1.2 mg/dL   GFR calc non Af Amer >90  >90 mL/min   GFR calc Af Amer >90  >90 mL/min  CBC      Component Value Range   WBC 7.0  4.0 - 10.5 K/uL   RBC 3.87  3.87 - 5.11 MIL/uL   Hemoglobin 11.2 (*) 12.0 - 15.0 g/dL   HCT 91.4 (*) 78.2 - 95.6 %   MCV  88.1  78.0 - 100.0 fL   MCH 28.9  26.0 - 34.0 pg   MCHC 32.8  30.0 - 36.0 g/dL   RDW 21.3  08.6 - 57.8 %   Platelets 214  150 - 400 K/uL  POCT PREGNANCY, URINE      Component Value Range   Preg Test, Ur POSITIVE (*) NEGATIVE  WET PREP, GENITAL      Component Value Range   Yeast Wet Prep HPF POC NONE SEEN  NONE SEEN   Trich, Wet Prep NONE SEEN  NONE SEEN   Clue Cells Wet Prep HPF POC FEW (*) NONE SEEN   WBC, Wet Prep HPF POC FEW (*) NONE SEEN   US Ob Limited  12/08/2011  *RADIOLOGY REPORT*  Clinical Data: Pelvic pain.  8-week-3-day gestational age by LMP.  LIMITED OBSTETRIC ULTRASOUND  Number of Fetuses: 1 Heart Rate: 140 bpm Movement: Yes Presentation: Cephalic Placental Location: Anterior Previa: No Amniotic Fluid (Subjective): Within normal limits;  Vertical pocket:  5.2cm  BPD: 3.8cm   17w   4d  MATERNAL FINDINGS: Cervix: Appears closed Uterus/Adnexae: No abnormality identified.  IMPRESSION: Single living intrauterine fetus with rough estimate of gestational age at 38-18 weeks.  No acute findings visualized.  Recommend followup with non-emergent complete OB 14+ wk US examination for fetal biometric evaluation and anatomic survey if not already performed.   Original Report Authenticated By: Myles Rosenthal, M.D.     7:13 PM US showed a single living intrauterine fetus at 17w 4 days. Pt does not have a GYN in town. Her pelvic exam is unremarkable there is no vaginal bleeding. VS normal. Pt is stable to d/c home at this time with tylenol for pain, and follow up with OB/GYN.   Filed Vitals:   12/08/11 1222 12/08/11 1858  BP: 128/63 112/67  Pulse: 81 80  Temp: 98.3 F (36.8 C) 98.3 F (36.8 C)  TempSrc: Oral Oral  Resp: 16 14  SpO2: 97% 100%    1. Abdominal pain in pregnancy       MDM  PT with lower abdominal cramping. Last period in august. Urine preg is positive. US obtained showing live intrauterine pregnancy at 17w 4d. Pt is non toxic. No vaginal bleeding, discharge. VS normal.  Pt stable for d/c home with GYN follow up.           Lottie Mussel, PA 12/08/11 2340

## 2011-12-08 NOTE — ED Notes (Signed)
Pt reports she took morning after pill on 10/04. Pt reports for past 3 weeks she has been having pelvic pain and pressure, minimal yellowish discharge. No menstrual cycle since she took the pill

## 2011-12-08 NOTE — ED Notes (Signed)
Pt. To U/S 

## 2011-12-09 LAB — GC/CHLAMYDIA PROBE AMP
CT Probe RNA: NEGATIVE
GC Probe RNA: NEGATIVE

## 2011-12-09 NOTE — ED Provider Notes (Signed)
Medical screening examination/treatment/procedure(s) were conducted as a shared visit with non-physician practitioner(s) and myself.  I personally evaluated the patient during the encounter  Conrad Zajkowski, MD 12/09/11 0049 

## 2013-05-01 ENCOUNTER — Encounter (HOSPITAL_COMMUNITY): Payer: Self-pay | Admitting: Emergency Medicine

## 2013-05-01 ENCOUNTER — Emergency Department (HOSPITAL_COMMUNITY)
Admission: EM | Admit: 2013-05-01 | Discharge: 2013-05-01 | Disposition: A | Discharge status: Home / Self Care | Payer: Self-pay | Source: Home / Self Care

## 2013-05-01 DIAGNOSIS — S0501XA Injury of conjunctiva and corneal abrasion without foreign body, right eye, initial encounter: Secondary | ICD-10-CM

## 2013-05-01 DIAGNOSIS — X58XXXA Exposure to other specified factors, initial encounter: Secondary | ICD-10-CM

## 2013-05-01 DIAGNOSIS — H109 Unspecified conjunctivitis: Secondary | ICD-10-CM

## 2013-05-01 DIAGNOSIS — S058X9A Other injuries of unspecified eye and orbit, initial encounter: Secondary | ICD-10-CM

## 2013-05-01 MED ORDER — POLYMYXIN B-TRIMETHOPRIM 10000-0.1 UNIT/ML-% OP SOLN
2.0000 [drp] | OPHTHALMIC | Status: AC
Start: 2013-05-01 — End: 2013-05-06

## 2013-05-01 NOTE — ED Notes (Addendum)
Pt c/o possible right eye infection onset 2 days ago. Pt reports that she does wear contact lenses and has been using the same pair for about a month and a half now. Pt reports she does not sleep in lenses. This morning eye had mucous and was swollen shut. Pt is alert and oriented and in no acute distress.

## 2013-05-01 NOTE — ED Provider Notes (Signed)
CSN: [id number]     Arrival date & time 05/01/13  1248 History   None    Chief Complaint  Patient presents with  . Conjunctivitis   (Consider location/radiation/quality/duration/timing/severity/associated sxs/prior Treatment) HPI Comments: Patient presents with right eye "irritation" and redness x 2 days. She has been wearing the same contact lens for a 6-7 weeks, and now having onset of irritation and drainage this am when she awoke. Noted "crust" this am. No pain or change in vision. Was at the beach a week ago and felt an "irritation" and thought maybe she got sand in the eye. It continued to bother some, but worse this am. No fever or chills.   Patient is a 33 y.o. female presenting with conjunctivitis. The history is provided by the patient.  Conjunctivitis    History reviewed. No pertinent past medical history. History reviewed. No pertinent past surgical history. No family history on file. History  Substance Use Topics  . Smoking status: Never Smoker   . Smokeless tobacco: Not on file  . Alcohol Use: Yes     Comment: occasionally   OB History   Grav Para Term Preterm Abortions TAB SAB Ect Mult Living                 Review of Systems  All other systems reviewed and are negative.   Allergies  Review of patient's allergies indicates no known allergies.  Home Medications   Prior to Admission medications   Medication Sig Start Date End Date Taking? Authorizing Provider  trimethoprim-polymyxin b (POLYTRIM) ophthalmic solution Place 2 drops into the right eye every 4 (four) hours. 05/01/13 05/06/13  Riki SheerMichelle G Young, PA-C   BP 117/82  Pulse 78  Temp(Src) 98.2 F (36.8 C) (Oral)  Resp 18  SpO2 99%  LMP 03/31/2013 Physical Exam  Nursing note and vitals reviewed. Constitutional: She is oriented to person, place, and time. She appears well-developed and well-nourished. No distress.  HENT:  Head: Normocephalic and atraumatic.  Eyes: Pupils are equal, round, and  reactive to light. Right eye exhibits no discharge. Left eye exhibits no discharge. No scleral icterus.  Sclera injection, beefy red conjunctiva is noted. Wood lamp shows minor abrasion at [employer]'oclock  Neurological: She is alert and oriented to person, place, and time.  Skin: Skin is warm and dry. No rash noted. She is not diaphoretic.  Psychiatric: Her behavior is normal.    ED Course  Procedures (including critical care time) Labs Review Labs Reviewed - No data to display  Imaging Review No results found.   MDM   1. Conjunctivitis   2. Corneal abrasion, right    Education. No contact lens wear for 10 days (she does not have any new pairs). Antibiotic Eye drops. Follow with Opth if worsens.     Riki SheerMichelle G Young, PA-C 05/01/13 1429

## 2013-05-01 NOTE — Discharge Instructions (Signed)
Corneal Abrasion The cornea is the clear covering at the front and center of the eye. When you look at the colored portion of the eye, you are looking through the cornea. It is a thin tissue made up of layers. The top layer is the most sensitive layer. A corneal abrasion happens if this layer is scratched or an injury causes it to come off.  HOME CARE  You may be given drops or a medicated cream. Use the medicine as told by your doctor.  A pressure patch may be put over the eye. If this is done, follow your doctor's instructions for when to remove the patch. Do not drive or use machines while the eye patch is on. Judging distances is hard to do with a patch on.  See your doctor for a follow-up exam if you are told to do so. It is very important that you keep this appointment. GET HELP IF:   You have pain, are sensitive to light, and have a scratchy feeling in one eye or both eyes.  Your pressure patch keeps getting loose. You can blink your eye under the patch.  You have fluid coming from your eye or the lids stick together in the morning.  You have the same symptoms in the morning that you did with the first abrasion. This could be days, weeks, or months after the first abrasion healed. MAKE SURE YOU:   Understand these instructions.  Will watch your condition.  Will get help right away if you are not doing well or get worse. Document Released: 06/11/2007 Document Revised: 10/13/2012 Document Reviewed: 08/30/2012 [facility] Patient Information 2014 [place], [place].    Use your drops 24 hours after symptoms resolve. No contact lens for 10 days. See your Opthalmologic if symptoms worsen

## 2013-05-04 NOTE — ED Provider Notes (Signed)
Medical screening examination/treatment/procedure(s) were performed by a resident physician or non-physician practitioner and as the supervising physician I was immediately available for consultation/collaboration.  Lesette Frary, MD    Stephaie Dardis S Cayle Thunder, MD 05/04/13 1231 

## 2013-05-09 ENCOUNTER — Encounter (HOSPITAL_COMMUNITY): Payer: Self-pay | Admitting: Emergency Medicine

## 2013-05-09 ENCOUNTER — Emergency Department (HOSPITAL_COMMUNITY)
Admission: EM | Admit: 2013-05-09 | Discharge: 2013-05-09 | Disposition: A | Discharge status: Home / Self Care | Payer: Self-pay | Attending: Emergency Medicine | Admitting: Emergency Medicine

## 2013-05-09 ENCOUNTER — Emergency Department (HOSPITAL_COMMUNITY): Payer: Self-pay

## 2013-05-09 DIAGNOSIS — S0083XA Contusion of other part of head, initial encounter: Secondary | ICD-10-CM | POA: Insufficient documentation

## 2013-05-09 DIAGNOSIS — S161XXA Strain of muscle, fascia and tendon at neck level, initial encounter: Secondary | ICD-10-CM

## 2013-05-09 DIAGNOSIS — Z3202 Encounter for pregnancy test, result negative: Secondary | ICD-10-CM | POA: Insufficient documentation

## 2013-05-09 DIAGNOSIS — S139XXA Sprain of joints and ligaments of unspecified parts of neck, initial encounter: Secondary | ICD-10-CM | POA: Insufficient documentation

## 2013-05-09 DIAGNOSIS — S0003XA Contusion of scalp, initial encounter: Secondary | ICD-10-CM | POA: Insufficient documentation

## 2013-05-09 DIAGNOSIS — S1093XA Contusion of unspecified part of neck, initial encounter: Principal | ICD-10-CM

## 2013-05-09 LAB — POC URINE PREG, ED: PREG TEST UR: NEGATIVE

## 2013-05-09 MED ORDER — ACETAMINOPHEN 325 MG PO TABS: 650.0000 mg | ORAL_TABLET | Freq: Four times a day (QID) | ORAL | Status: DC | PRN

## 2013-05-09 MED ORDER — ACETAMINOPHEN 325 MG PO TABS
650.0000 mg | ORAL_TABLET | Freq: Once | ORAL | Status: AC
  Administered 2013-05-09: 650 mg via ORAL
  Filled 2013-05-09: qty 2
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
## 2013-05-09 NOTE — Discharge Instructions (Signed)
Take Tylenol as directed as needed for headache. Rest, apply ice.  Cervical Sprain A cervical sprain is an injury in the neck in which the strong, fibrous tissues (ligaments) that connect your neck bones stretch or tear. Cervical sprains can range from mild to severe. Severe cervical sprains can cause the neck vertebrae to be unstable. This can lead to damage of the spinal cord and can result in serious nervous system problems. The amount of time it takes for a cervical sprain to get better depends on the cause and extent of the injury. Most cervical sprains heal in 1 to 3 weeks. CAUSES  Severe cervical sprains may be caused by:   Contact sport injuries (such as from football, rugby, wrestling, hockey, auto racing, gymnastics, diving, martial arts, or boxing).   Motor vehicle collisions.   Whiplash injuries. This is an injury from a sudden forward-and backward whipping movement of the head and neck.  Falls.  Mild cervical sprains may be caused by:   Being in an awkward position, such as while cradling a telephone between your ear and shoulder.   Sitting in a chair that does not offer proper support.   Working at a poorly Marketing executivedesigned computer station.   Looking up or down for long periods of time.  SYMPTOMS   Pain, soreness, stiffness, or a burning sensation in the front, back, or sides of the neck. This discomfort may develop immediately after the injury or slowly, 24 hours or more after the injury.   Pain or tenderness directly in the middle of the back of the neck.   Shoulder or upper back pain.   Limited ability to move the neck.   Headache.   Dizziness.   Weakness, numbness, or tingling in the hands or arms.   Muscle spasms.   Difficulty swallowing or chewing.   Tenderness and swelling of the neck.  DIAGNOSIS  Most of the time your health care provider can diagnose a cervical sprain by taking your history and doing a physical exam. Your health care  provider will ask about previous neck injuries and any known neck problems, such as arthritis in the neck. X-rays may be taken to find out if there are any other problems, such as with the bones of the neck. Other tests, such as a CT scan or MRI, may also be needed.  TREATMENT  Treatment depends on the severity of the cervical sprain. Mild sprains can be treated with rest, keeping the neck in place (immobilization), and pain medicines. Severe cervical sprains are immediately immobilized. Further treatment is done to help with pain, muscle spasms, and other symptoms and may include:  Medicines, such as pain relievers, numbing medicines, or muscle relaxants.   Physical therapy. This may involve stretching exercises, strengthening exercises, and posture training. Exercises and improved posture can help stabilize the neck, strengthen muscles, and help stop symptoms from returning.  HOME CARE INSTRUCTIONS   Put ice on the injured area.   Put ice in a plastic bag.   Place a towel between your skin and the bag.   Leave the ice on for 15 20 minutes, 3 4 times a day.   If your injury was severe, you may have been given a cervical collar to wear. A cervical collar is a two-piece collar designed to keep your neck from moving while it heals.  Do not remove the collar unless instructed by your health care provider.  If you have long hair, keep it outside of the collar.  Ask your health care provider before making any adjustments to your collar. Minor adjustments may be required over time to improve comfort and reduce pressure on your chin or on the back of your head.  Ifyou are allowed to remove the collar for cleaning or bathing, follow your health care provider's instructions on how to do so safely.  Keep your collar clean by wiping it with mild soap and water and drying it completely. If the collar you have been given includes removable pads, remove them every 1 2 days and hand wash them with  soap and water. Allow them to air dry. They should be completely dry before you wear them in the collar.  If you are allowed to remove the collar for cleaning and bathing, wash and dry the skin of your neck. Check your skin for irritation or sores. If you see any, tell your health care provider.  Do not drive while wearing the collar.   Only take over-the-counter or prescription medicines for pain, discomfort, or fever as directed by your health care provider.   Keep all follow-up appointments as directed by your health care provider.   Keep all physical therapy appointments as directed by your health care provider.   Make any needed adjustments to your workstation to promote good posture.   Avoid positions and activities that make your symptoms worse.   Warm up and stretch before being active to help prevent problems.  SEEK MEDICAL CARE IF:   Your pain is not controlled with medicine.   You are unable to decrease your pain medicine over time as planned.   Your activity level is not improving as expected.  SEEK IMMEDIATE MEDICAL CARE IF:   You develop any bleeding.  You develop stomach upset.  You have signs of an allergic reaction to your medicine.   Your symptoms get worse.   You develop new, unexplained symptoms.   You have numbness, tingling, weakness, or paralysis in any part of your body.  MAKE SURE YOU:   Understand these instructions.  Will watch your condition.  Will get help right away if you are not doing well or get worse. Document Released: 10/20/2006 Document Revised: 10/13/2012 Document Reviewed: 06/30/2012 [facility] Patient Information 2014 [place], [place].  Head Injury, Adult You have a head injury. Headaches and throwing up (vomiting) are common after a head injury. It should be easy to wake up from sleeping. Sometimes you must stay in the hospital. Most problems happen within the first 24 hours. Side effects may occur up to 7 10 days  after the injury.  WHAT ARE THE TYPES OF HEAD INJURIES? Head injuries can be as minor as a bump. Some head injuries can be more severe. More severe head injuries include:  A jarring injury to the brain (concussion).  A bruise of the brain (contusion). This mean there is bleeding in the brain that can cause swelling.  A cracked skull (skull fracture).  Bleeding in the brain that collects, clots, and forms a bump (hematoma). . WHEN SHOULD I GET HELP RIGHT AWAY?   You are confused or sleepy.  You cannot be woken up.  You feel sick to your stomach (nauseous) or keep throwing up.  Your dizziness or unsteadiness is get worse.  You have very bad, lasting headaches that are not helped by medicine.  You cannot use your arms or legs like normal  You cannot walk.  You notice changes in the black spots in the center of the colored part  of your eye (pupil).  You have clear or bloody fluid coming from your nose or ears.  You have trouble seeing. During the next 24 hours after the injury, you must stay with someone who can watch you. This person should get help right away (call 911 in the [place] if you start to shake and are not able to control it (seizures), you become pass out, or you are unable to wake up. HOW CAN I PREVENT A HEAD INJURY IN THE FUTURE?  Wear seat belts.  Wear helmets while bike riding and playing sports like football.  Stay away from dangerous activities around the house. WHEN CAN I RETURN TO NORMAL ACTIVITIES AND ATHLETICS? See your doctor before doing these activities. You should not do normal activities or play contact sports until 1 week after the following symptoms have stopped:  Headache that does not go away.  Dizziness.  Poor attention.  Confusion.  Memory problems.  Sickness to your stomach or throwing up.  Tiredness.  Fussiness.  Bothered by bright lights or loud noises.  Anxiousness or depression.  Restless sleep. MAKE SURE YOU:    Understand these instructions.  Will watch your condition.  Will get help right away if you are not doing well or get worse. Document Released: 12/06/2007 Document Revised: 10/13/2012 Document Reviewed: 08/30/2012 [facility] Patient Information 2014 [place], [place].  Hematoma A hematoma is a collection of blood under the skin, in an organ, in a body space, in a joint space, or in other tissue. The blood can clot to form a lump that you can see and feel. The lump is often firm and may sometimes become sore and tender. Most hematomas get better in a few days to weeks. However, some hematomas may be serious and require medical care. Hematomas can range in size from very small to very large. CAUSES  A hematoma can be caused by a blunt or penetrating injury. It can also be caused by spontaneous leakage from a blood vessel under the skin. Spontaneous leakage from a blood vessel is more likely to occur in older people, especially those taking blood thinners. Sometimes, a hematoma can develop after certain medical procedures. SIGNS AND SYMPTOMS   A firm lump on the body.  Possible pain and tenderness in the area.  Bruising.Blue, dark blue, purple-red, or yellowish skin may appear at the site of the hematoma if the hematoma is close to the surface of the skin. For hematomas in deeper tissues or body spaces, the signs and symptoms may be subtle. For example, an intra-abdominal hematoma may cause abdominal pain, weakness, fainting, and shortness of breath. An intracranial hematoma may cause a headache or symptoms such as weakness, trouble speaking, or a change in consciousness. DIAGNOSIS  A hematoma can usually be diagnosed based on your medical history and a physical exam. Imaging tests may be needed if your health care provider suspects a hematoma in deeper tissues or body spaces, such as the abdomen, head, or chest. These tests may include ultrasonography or a CT scan.  TREATMENT  Hematomas usually  go away on their own over time. Rarely does the blood need to be drained out of the body. Large hematomas or those that may affect vital organs will sometimes need surgical drainage or monitoring. HOME CARE INSTRUCTIONS   Apply ice to the injured area:   Put ice in a plastic bag.   Place a towel between your skin and the bag.   Leave the ice on for 20 minutes, 2  3 times a day for the first 1 to 2 days.   After the first 2 days, switch to using warm compresses on the hematoma.   Elevate the injured area to help decrease pain and swelling. Wrapping the area with an elastic bandage may also be helpful. Compression helps to reduce swelling and promotes shrinking of the hematoma. Make sure the bandage is not wrapped too tight.   If your hematoma is on a lower extremity and is painful, crutches may be helpful for a couple days.   Only take over-the-counter or prescription medicines as directed by your health care provider. SEEK IMMEDIATE MEDICAL CARE IF:   You have increasing pain, or your pain is not controlled with medicine.   You have a fever.   You have worsening swelling or discoloration.   Your skin over the hematoma breaks or starts bleeding.   Your hematoma is in your chest or abdomen and you have weakness, shortness of breath, or a change in consciousness.  Your hematoma is on your scalp (caused by a fall or injury) and you have a worsening headache or a change in alertness or consciousness. MAKE SURE YOU:   Understand these instructions.  Will watch your condition.  Will get help right away if you are not doing well or get worse. Document Released: 08/07/2003 Document Revised: 08/25/2012 Document Reviewed: 06/02/2012 [facility] Patient Information 2014 [place], [place].
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
## 2014-11-14 ENCOUNTER — Telehealth (HOSPITAL_COMMUNITY): Payer: Self-pay | Admitting: *Deleted
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
## 2014-11-22 ENCOUNTER — Encounter (HOSPITAL_COMMUNITY): Payer: Self-pay | Admitting: *Deleted

## 2014-11-28 ENCOUNTER — Ambulatory Visit (HOSPITAL_COMMUNITY)
Admission: RE | Admit: 2014-11-28 | Discharge: 2014-11-28 | Disposition: A | Discharge status: Home / Self Care | Payer: Self-pay | Source: Ambulatory Visit | Attending: Obstetrics and Gynecology | Admitting: Obstetrics and Gynecology

## 2014-11-28 ENCOUNTER — Encounter (HOSPITAL_COMMUNITY): Payer: Self-pay

## 2014-11-28 VITALS — BP 110/64 | Temp 98.4°F | Ht 62.0 in | Wt 185.0 lb

## 2014-11-28 DIAGNOSIS — Z1239 Encounter for other screening for malignant neoplasm of breast: Secondary | ICD-10-CM

## 2014-11-28 DIAGNOSIS — R87612 Low grade squamous intraepithelial lesion on cytologic smear of cervix (LGSIL): Secondary | ICD-10-CM

## 2014-11-28 NOTE — Patient Instructions (Addendum)
Educational materials on self breast awareness given. Explained the colposcopy to [name] that is needed for follow up ofMinerva Daniel abnormal Pap smear. [facility] follow up for abnormal Pap smear. Appointment scheduled for Friday, December 22, 2014 at 0900. Patient aware of appointment and will be there. Let patient know a screening mammogram is recommended at age 34 unless clinically indicated prior.  Karen EndsMarseddez E Turpen verbalized understanding.  [name], [name], RN 12:03 PM

## 2014-11-28 NOTE — Progress Notes (Signed)
[facility] to follow up for abnormal Pap smear 11/01/2014.  Pap Smear: Pap smear not completed today. Last [facility] and [facility]. [facility] for colposcopy to follow up for abnormal Pap smear. Appointment scheduled for Friday, December 22, 2014 at 0900. Per patient has no history of an abnormal Pap smear prior to the most recent Pap smear. Pap smear result is scanned in EPIC under media.  Physical exam: Breasts Breasts symmetrical. No skin abnormalities bilateral breasts. No nipple retraction bilateral breasts. No nipple discharge bilateral breasts. No lymphadenopathy. No lumps palpated bilateral breasts. No complaints of pain or tenderness on exam. Screening mammogram recommended at age 32 unless clinically indicated prior.     Pelvic/Bimanual No Pap smear completed today since last Pap smear was 11/01/2014. Pap smear not indicated per [facility] guidelines.

## 2014-12-22 ENCOUNTER — Other Ambulatory Visit (HOSPITAL_COMMUNITY)
Admission: RE | Admit: 2014-12-22 | Discharge: 2014-12-22 | Disposition: A | Discharge status: Home / Self Care | Payer: Self-pay | Source: Ambulatory Visit | Attending: Obstetrics and Gynecology | Admitting: Obstetrics and Gynecology

## 2014-12-22 ENCOUNTER — Encounter: Payer: Self-pay | Admitting: Obstetrics and Gynecology

## 2014-12-22 ENCOUNTER — Ambulatory Visit (INDEPENDENT_AMBULATORY_CARE_PROVIDER_SITE_OTHER): Payer: Self-pay | Admitting: Obstetrics and Gynecology

## 2014-12-22 VITALS — BP 115/73 | HR 72 | Temp 97.8°F | Ht 64.0 in | Wt 189.0 lb

## 2014-12-22 DIAGNOSIS — Z3202 Encounter for pregnancy test, result negative: Secondary | ICD-10-CM

## 2014-12-22 DIAGNOSIS — R87612 Low grade squamous intraepithelial lesion on cytologic smear of cervix (LGSIL): Secondary | ICD-10-CM

## 2014-12-22 DIAGNOSIS — G8918 Other acute postprocedural pain: Secondary | ICD-10-CM

## 2014-12-22 LAB — POCT PREGNANCY, URINE: Preg Test, Ur: NEGATIVE

## 2014-12-22 MED ORDER — IBUPROFEN 200 MG PO TABS
600.0000 mg | ORAL_TABLET | Freq: Once | ORAL | Status: AC
  Administered 2014-12-22: 600 mg via ORAL

## 2014-12-22 NOTE — Progress Notes (Signed)
Patient ID: [name] E Glover, female   DOB: 01/26/1980, 34 y.o.   MRN: [id number]   Patient with [facility] on 11/01/2014  Patient given informed consent, signed copy in the chart, time out was performed.  Placed in lithotomy position. Cervix viewed with speculum and colposcope after application of acetic acid.   Colposcopy adequate?  yes Acetowhite lesions?yes 6 o'clock Punctation?no Mosaicism?  no Abnormal vasculature?  no Biopsies? 6 o'clock ECC?yes  COMMENTS: Patient was given post procedure instructions.  She will return in 2 weeks for results.  Catalina AntiguaONSTANT,Derica Leiber, MD

## 2014-12-22 NOTE — Addendum Note (Signed)
Addended by: Gerome ApleyZEYFANG, Jenicka Coxe L on: 12/22/2014 10:22 AM   Modules accepted: Orders

## 2015-01-02 ENCOUNTER — Telehealth: Payer: Self-pay | Admitting: *Deleted

## 2015-01-02 NOTE — Telephone Encounter (Signed)
Called patient and informed her of results. She had no further questions.  

## 2015-01-02 NOTE — Telephone Encounter (Signed)
Per Dr. Jolayne Pantheronstant biopsy results consistent with pap smear. Needs repeat pap in 1 year.

## 2015-06-06 ENCOUNTER — Inpatient Hospital Stay (HOSPITAL_COMMUNITY): Payer: Self-pay

## 2015-06-06 ENCOUNTER — Inpatient Hospital Stay (HOSPITAL_COMMUNITY)
Admission: AD | Admit: 2015-06-06 | Discharge: 2015-06-06 | Disposition: A | Discharge status: Home / Self Care | Payer: Self-pay | Source: Ambulatory Visit | Attending: Obstetrics and Gynecology | Admitting: Obstetrics and Gynecology

## 2015-06-06 ENCOUNTER — Encounter (HOSPITAL_COMMUNITY): Payer: Self-pay | Admitting: Student

## 2015-06-06 DIAGNOSIS — R109 Unspecified abdominal pain: Secondary | ICD-10-CM

## 2015-06-06 DIAGNOSIS — O23592 Infection of other part of genital tract in pregnancy, second trimester: Secondary | ICD-10-CM | POA: Insufficient documentation

## 2015-06-06 DIAGNOSIS — O23591 Infection of other part of genital tract in pregnancy, first trimester: Secondary | ICD-10-CM

## 2015-06-06 DIAGNOSIS — O26899 Other specified pregnancy related conditions, unspecified trimester: Secondary | ICD-10-CM

## 2015-06-06 DIAGNOSIS — B9689 Other specified bacterial agents as the cause of diseases classified elsewhere: Secondary | ICD-10-CM

## 2015-06-06 DIAGNOSIS — N760 Acute vaginitis: Secondary | ICD-10-CM

## 2015-06-06 DIAGNOSIS — Z3A16 16 weeks gestation of pregnancy: Secondary | ICD-10-CM | POA: Insufficient documentation

## 2015-06-06 LAB — WET PREP, GENITAL
SPERM: NONE SEEN
TRICH WET PREP: NONE SEEN
Yeast Wet Prep HPF POC: NONE SEEN

## 2015-06-06 LAB — URINALYSIS, ROUTINE W REFLEX MICROSCOPIC
Bilirubin Urine: NEGATIVE
GLUCOSE, UA: NEGATIVE mg/dL
Hgb urine dipstick: NEGATIVE
KETONES UR: NEGATIVE mg/dL
NITRITE: NEGATIVE
PROTEIN: NEGATIVE mg/dL
Specific Gravity, Urine: 1.02 (ref 1.005–1.030)
pH: 5.5 (ref 5.0–8.0)

## 2015-06-06 LAB — URINE MICROSCOPIC-ADD ON: RBC / HPF: NONE SEEN RBC/hpf (ref 0–5)

## 2015-06-06 LAB — CBC
HCT: 35.1 % — ABNORMAL LOW (ref 36.0–46.0)
Hemoglobin: 11.4 g/dL — ABNORMAL LOW (ref 12.0–15.0)
MCH: 26.8 pg (ref 26.0–34.0)
MCHC: 32.5 g/dL (ref 30.0–36.0)
MCV: 82.4 fL (ref 78.0–100.0)
PLATELETS: 242 10*3/uL (ref 150–400)
RBC: 4.26 MIL/uL (ref 3.87–5.11)
RDW: 14.7 % (ref 11.5–15.5)
WBC: 7.5 10*3/uL (ref 4.0–10.5)

## 2015-06-06 LAB — HCG, QUANTITATIVE, PREGNANCY: hCG, Beta Chain, Quant, S: 6726 m[IU]/mL — ABNORMAL HIGH (ref <5)

## 2015-06-06 LAB — POCT PREGNANCY, URINE: Preg Test, Ur: POSITIVE — AB

## 2015-06-06 LAB — ABO/RH: ABO/RH(D): O POS

## 2015-06-06 MED ORDER — METRONIDAZOLE 500 MG PO TABS: 500.0000 mg | ORAL_TABLET | Freq: Two times a day (BID) | ORAL | Status: DC

## 2015-06-06 NOTE — Discharge Instructions (Signed)

## 2015-06-06 NOTE — MAU Note (Signed)
Patient presents to mau with c/o +HPT; has not had a period since beginning of February. Denies VB at this time. Endorse right sided pressure.

## 2015-06-06 NOTE — MAU Note (Signed)
Assumed care of patient.

## 2015-06-06 NOTE — MAU Provider Note (Signed)
History     CSN: [id number]  Arrival date and time: 06/06/15 1712   First Provider Initiated Contact with Patient 06/06/15 1804        Chief Complaint  Patient presents with  . Abdominal Pain  . Vaginal Discharge   HPI [name] is a 35 y.o. [id number] at [redacted]w[redacted]d by unsure LMP who presents with abdominal pain & vaginal discharge.  Reports abdominal pressure in lower abdomen that has been constant x 1 week. Also has noticed clear watery discharge. No odor or irritation. Denies vaginal bleeding. LMP the end of February, has history of irregular periods.   OB History    Gravida Para Term Preterm AB TAB SAB Ectopic Multiple Living   5 3 3  1 1    3       History reviewed. No pertinent past medical history.  Past Surgical History  Procedure Laterality Date  . Tummy tuck      Family History  Problem Relation Age of Onset  . Breast cancer Mother   . Diabetes Maternal Grandmother     Social History  Substance Use Topics  . Smoking status: Never Smoker   . Smokeless tobacco: None  . Alcohol Use: No     Comment: occasionally    Allergies: No Known Allergies  No prescriptions prior to admission    Review of Systems  Constitutional: Negative.   Gastrointestinal: Positive for nausea and abdominal pain. Negative for vomiting, diarrhea and constipation.  Genitourinary: Negative for dysuria.       + vaginal discharge No vaginal bleeding   Physical Exam   Blood pressure 122/74, pulse 76, temperature 98.2 F (36.8 C), temperature source Oral, resp. rate 16, height 5\' 4"  (1.626 m), weight 194 lb (87.998 kg), last menstrual period 02/09/2015, SpO2 100 %.  Physical Exam  Nursing note and vitals reviewed. Constitutional: She is oriented to person, place, and time. She appears well-developed and well-nourished. No distress.  HENT:  Head: Normocephalic and atraumatic.  Eyes: Conjunctivae are normal. Right eye exhibits no discharge. Left eye exhibits no discharge. No  scleral icterus.  Neck: Normal range of motion.  Cardiovascular: Normal rate, regular rhythm and normal heart sounds.   No murmur heard. Respiratory: Effort normal and breath sounds normal. No respiratory distress. She has no wheezes.  GI: Soft. Bowel sounds are normal. She exhibits no distension. There is no tenderness. There is no rebound and no guarding.  Genitourinary: Uterus normal. Cervix exhibits friability. Cervix exhibits no motion tenderness. No bleeding in the vagina. Vaginal discharge (small amount of thin white discharge) found.  Neurological: She is alert and oriented to person, place, and time.  Skin: Skin is warm and dry. She is not diaphoretic.  Psychiatric: She has a normal mood and affect. Her behavior is normal. Judgment and thought content normal.    MAU Course  Procedures Results for orders placed or performed during the hospital encounter of 06/06/15 (from the past 24 hour(s))  Urinalysis, Routine w reflex microscopic (not at [facility])     Status: Abnormal   Collection Time: 06/06/15  5:36 PM  Result Value Ref Range   Color, Urine YELLOW YELLOW   APPearance HAZY (A) CLEAR   Specific Gravity, Urine 1.020 1.005 - 1.030   pH 5.5 5.0 - 8.0   Glucose, UA NEGATIVE NEGATIVE mg/dL   Hgb urine dipstick NEGATIVE NEGATIVE   Bilirubin Urine NEGATIVE NEGATIVE   Ketones, ur NEGATIVE NEGATIVE mg/dL   Protein, ur NEGATIVE NEGATIVE mg/dL  Nitrite NEGATIVE NEGATIVE   Leukocytes, UA MODERATE (A) NEGATIVE  Urine microscopic-add on     Status: Abnormal   Collection Time: 06/06/15  5:36 PM  Result Value Ref Range   Squamous Epithelial / LPF 0-5 (A) NONE SEEN   WBC, UA 0-5 0 - 5 WBC/hpf   RBC / HPF NONE SEEN 0 - 5 RBC/hpf   Bacteria, UA RARE (A) NONE SEEN  Pregnancy, urine POC     Status: Abnormal   Collection Time: 06/06/15  5:44 PM  Result Value Ref Range   Preg Test, Ur POSITIVE (A) NEGATIVE  CBC     Status: Abnormal   Collection Time: 06/06/15  6:07 PM  Result Value Ref  Range   WBC 7.5 4.0 - 10.5 K/uL   RBC 4.26 3.87 - 5.11 MIL/uL   Hemoglobin 11.4 (L) 12.0 - 15.0 g/dL   HCT 09.835.1 (L) 11.936.0 - 14.746.0 %   MCV 82.4 78.0 - 100.0 fL   MCH 26.8 26.0 - 34.0 pg   MCHC 32.5 30.0 - 36.0 g/dL   RDW 82.914.7 56.211.5 - 13.015.5 %   Platelets 242 150 - 400 K/uL  ABO/Rh     Status: None (Preliminary result)   Collection Time: 06/06/15  6:07 PM  Result Value Ref Range   ABO/RH(D) O POS   hCG, quantitative, pregnancy     Status: Abnormal   Collection Time: 06/06/15  6:07 PM  Result Value Ref Range   hCG, Beta Chain, Quant, S 6726 (H) <5 mIU/mL  Wet prep, genital     Status: Abnormal   Collection Time: 06/06/15  6:37 PM  Result Value Ref Range   Yeast Wet Prep HPF POC NONE SEEN NONE SEEN   Trich, Wet Prep NONE SEEN NONE SEEN   Clue Cells Wet Prep HPF POC PRESENT (A) NONE SEEN   WBC, Wet Prep HPF POC MANY (A) NONE SEEN   Sperm NONE SEEN    [place] Ob Comp Less 14 Wks  06/06/2015  CLINICAL DATA:  Right-sided abdominal discomfort EXAM: OBSTETRIC <14 WK US AND TRANSVAGINAL OB US TECHNIQUE: Both transabdominal and transvaginal ultrasound examinations were performed for complete evaluation of the gestation as well as the maternal uterus, adnexal regions, and pelvic cul-de-sac. Transvaginal technique was performed to assess early pregnancy. COMPARISON:  None. FINDINGS: There is an oval fluid collection within the endometrial canal with a mean diameter of 1.98 cm. There may be a subtle yolk sac seen anteriorly near the fundus within this fluid however, the presence of a yolk sac is uncertain. No embryo is identified. The ovaries are normal in appearance. Possible small subchorionic hemorrhage. No free fluid seen within the pelvis. No other abnormalities are identified. IMPRESSION: 1. There is an oval collection of fluid in the endometrial canal. While there is suggestion of a possible yolk sac, this is not certain. As a result, this could represent a gestational sac or a pseudo gestational sac and  the findings could represent early pregnancy, recent miscarriage, or ectopic pregnancy. Complicating the issue is the fact that the patient has irregular menstrual cycles with the last menstrual cycle in February of 2017 suggesting a possible 16 week 5 day pregnancy. If the oval collection in the uterus is indeed a gestational sac, it would correlate with a gestational age of [redacted] weeks and 6 days. Recommend correlation with labs and follow-up imaging as clinically warranted. Electronically Signed   By: Gerome Samavid  Williams III M.D   On: 06/06/2015 19:52   [place]  Ob Transvaginal  06/06/2015  CLINICAL DATA:  Right-sided abdominal discomfort EXAM: OBSTETRIC <14 WK [place] AND TRANSVAGINAL OB US TECHNIQUE: Both transabdominal and transvaginal ultrasound examinations were performed for complete evaluation of the gestation as well as the maternal uterus, adnexal regions, and pelvic cul-de-sac. Transvaginal technique was performed to assess early pregnancy. COMPARISON:  None. FINDINGS: There is an oval fluid collection within the endometrial canal with a mean diameter of 1.98 cm. There may be a subtle yolk sac seen anteriorly near the fundus within this fluid however, the presence of a yolk sac is uncertain. No embryo is identified. The ovaries are normal in appearance. Possible small subchorionic hemorrhage. No free fluid seen within the pelvis. No other abnormalities are identified. IMPRESSION: 1. There is an oval collection of fluid in the endometrial canal. While there is suggestion of a possible yolk sac, this is not certain. As a result, this could represent a gestational sac or a pseudo gestational sac and the findings could represent early pregnancy, recent miscarriage, or ectopic pregnancy. Complicating the issue is the fact that the patient has irregular menstrual cycles with the last menstrual cycle in February of 2017 suggesting a possible 16 week 5 day pregnancy. If the oval collection in the uterus is indeed a gestational  sac, it would correlate with a gestational age of [redacted] weeks and 6 days. Recommend correlation with labs and follow-up imaging as clinically warranted. Electronically Signed   By: Gerome Sam III M.D   On: 06/06/2015 19:52    MDM UPT positive O positive Unable to doppler FHT -- based on bimanual exam, uterus not enlarged -- will send for ultrasound Ultrasound shows IUGS with probably yolk sac Will have pt return in 48 hrs for BHCG since unsure yolk sac  Assessment and Plan  A: 1. BV (bacterial vaginosis)   2. Abdominal pain affecting pregnancy     P: Discharge home Rx flagyl Return Friday evening to MAU for BHCG Discussed reasons to return to MAU  Judeth Horn 06/06/2015, 6:04 PM

## 2015-06-07 LAB — HIV ANTIBODY (ROUTINE TESTING W REFLEX): HIV Screen 4th Generation wRfx: NONREACTIVE

## 2015-06-07 LAB — GC/CHLAMYDIA PROBE AMP (~~LOC~~) NOT AT ARMC
Chlamydia: NEGATIVE
NEISSERIA GONORRHEA: NEGATIVE

## 2015-06-13 ENCOUNTER — Inpatient Hospital Stay (HOSPITAL_COMMUNITY)
Admission: AD | Admit: 2015-06-13 | Discharge: 2015-06-14 | Disposition: A | Discharge status: Home / Self Care | Payer: Self-pay | Source: Ambulatory Visit | Attending: Obstetrics and Gynecology | Admitting: Obstetrics and Gynecology

## 2015-06-13 DIAGNOSIS — Z3A01 Less than 8 weeks gestation of pregnancy: Secondary | ICD-10-CM | POA: Insufficient documentation

## 2015-06-13 DIAGNOSIS — O039 Complete or unspecified spontaneous abortion without complication: Secondary | ICD-10-CM | POA: Insufficient documentation

## 2015-06-14 ENCOUNTER — Encounter (HOSPITAL_COMMUNITY): Payer: Self-pay

## 2015-06-14 ENCOUNTER — Inpatient Hospital Stay (HOSPITAL_COMMUNITY): Payer: Self-pay

## 2015-06-14 DIAGNOSIS — O039 Complete or unspecified spontaneous abortion without complication: Secondary | ICD-10-CM

## 2015-06-14 LAB — CBC
HEMATOCRIT: 36.6 % (ref 36.0–46.0)
HEMOGLOBIN: 12 g/dL (ref 12.0–15.0)
MCH: 26.8 pg (ref 26.0–34.0)
MCHC: 32.8 g/dL (ref 30.0–36.0)
MCV: 81.9 fL (ref 78.0–100.0)
PLATELETS: 270 10*3/uL (ref 150–400)
RBC: 4.47 MIL/uL (ref 3.87–5.11)
RDW: 14.7 % (ref 11.5–15.5)
WBC: 11.5 10*3/uL — AB (ref 4.0–10.5)

## 2015-06-14 LAB — URINE MICROSCOPIC-ADD ON

## 2015-06-14 LAB — URINALYSIS, ROUTINE W REFLEX MICROSCOPIC
Bilirubin Urine: NEGATIVE
Glucose, UA: NEGATIVE mg/dL
LEUKOCYTES UA: NEGATIVE
NITRITE: NEGATIVE
PROTEIN: 30 mg/dL — AB
Specific Gravity, Urine: 1.025 (ref 1.005–1.030)
pH: 5 (ref 5.0–8.0)

## 2015-06-14 LAB — HCG, QUANTITATIVE, PREGNANCY: HCG, BETA CHAIN, QUANT, S: 2733 m[IU]/mL — AB (ref <5)

## 2015-06-14 MED ORDER — PROMETHAZINE HCL 25 MG PO TABS: 12.5000 mg | ORAL_TABLET | Freq: Four times a day (QID) | ORAL | Status: DC | PRN

## 2015-06-14 MED ORDER — HYDROCODONE-ACETAMINOPHEN 5-325 MG PO TABS: 1.0000 | ORAL_TABLET | ORAL | Status: DC | PRN

## 2015-06-14 MED ORDER — IBUPROFEN 800 MG PO TABS: 800.0000 mg | ORAL_TABLET | Freq: Three times a day (TID) | ORAL | Status: DC | PRN

## 2015-06-14 NOTE — Discharge Instructions (Signed)
Miscarriage  A miscarriage is the sudden loss of an unborn baby (fetus) before the 20th week of pregnancy. Most miscarriages happen in the first 3 months of pregnancy. Sometimes, it happens before a woman even knows she is pregnant. A miscarriage is also called a "spontaneous miscarriage" or "early pregnancy loss." Having a miscarriage can be an emotional experience. Talk with your caregiver about any questions you may have about miscarrying, the grieving process, and your future pregnancy plans.  CAUSES    Problems with the fetal chromosomes that make it impossible for the baby to develop normally. Problems with the baby's genes or chromosomes are most often the result of errors that occur, by chance, as the embryo divides and grows. The problems are not inherited from the parents.   Infection of the cervix or uterus.    Hormone problems.    Problems with the cervix, such as having an incompetent cervix. This is when the tissue in the cervix is not strong enough to hold the pregnancy.    Problems with the uterus, such as an abnormally shaped uterus, uterine fibroids, or congenital abnormalities.    Certain medical conditions.    Smoking, drinking alcohol, or taking illegal drugs.    Trauma.   Often, the cause of a miscarriage is unknown.   SYMPTOMS    Vaginal bleeding or spotting, with or without cramps or pain.   Pain or cramping in the abdomen or lower back.   Passing fluid, tissue, or blood clots from the vagina.  DIAGNOSIS   Your caregiver will perform a physical exam. You may also have an ultrasound to confirm the miscarriage. Blood or urine tests may also be ordered.  TREATMENT    Sometimes, treatment is not necessary if you naturally pass all the fetal tissue that was in the uterus. If some of the fetus or placenta remains in the body (incomplete miscarriage), tissue left behind may become infected and must be removed. Usually, a dilation and curettage (D and C) procedure is performed.  During a D and C procedure, the cervix is widened (dilated) and any remaining fetal or placental tissue is gently removed from the uterus.   Antibiotic medicines are prescribed if there is an infection. Other medicines may be given to reduce the size of the uterus (contract) if there is a lot of bleeding.   If you have Rh negative blood and your baby was Rh positive, you will need a Rh immunoglobulin shot. This shot will protect any future baby from having Rh blood problems in future pregnancies.  HOME CARE INSTRUCTIONS    Your caregiver may order bed rest or may allow you to continue light activity. Resume activity as directed by your caregiver.   Have someone help with home and family responsibilities during this time.    Keep track of the number of sanitary pads you use each day and how soaked (saturated) they are. Write down this information.    Do not use tampons. Do not douche or have sexual intercourse until approved by your caregiver.    Only take over-the-counter or prescription medicines for pain or discomfort as directed by your caregiver.    Do not take aspirin. Aspirin can cause bleeding.    Keep all follow-up appointments with your caregiver.    If you or your partner have problems with grieving, talk to your caregiver or seek counseling to help cope with the pregnancy loss. Allow enough time to grieve before trying to get pregnant again.     SEEK IMMEDIATE MEDICAL CARE IF:    You have severe cramps or pain in your back or abdomen.   You have a fever.   You pass large blood clots (walnut-sized or larger) ortissue from your vagina. Save any tissue for your caregiver to inspect.    Your bleeding increases.    You have a thick, bad-smelling vaginal discharge.   You become lightheaded, weak, or you faint.    You have chills.   MAKE SURE YOU:   Understand these instructions.   Will watch your condition.   Will get help right away if you are not doing well or get worse.     This  information is not intended to replace advice given to you by your health care provider. Make sure you discuss any questions you have with your health care provider.     Document Released: 06/18/2000 Document Revised: 04/19/2012 Document Reviewed: 02/11/2011  Elsevier Interactive Patient Education 2016 [employer]

## 2015-06-14 NOTE — MAU Provider Note (Signed)
History     CSN: [id number]  Arrival date and time: 06/13/15 2344   First Provider Initiated Contact with Patient 06/14/15 0011      Chief Complaint  Patient presents with  . Vaginal Bleeding  . Pelvic Pain   Vaginal Bleeding This is a new problem. The current episode started yesterday. The problem has been gradually worsening. Pain severity now: 7/10  The problem affects both sides. She is pregnant. Associated symptoms include abdominal pain. Pertinent negatives include no chills, constipation, diarrhea, dysuria, fever, frequency, nausea, urgency or vomiting. The vaginal discharge was bloody. The vaginal bleeding is heavier than menses. She has been passing clots. She has not been passing tissue. Nothing aggravates the symptoms. She has tried nothing for the symptoms. Her menstrual history has been irregular (LMP 02/09/15, +HPT at the end of March. ).     History reviewed. No pertinent past medical history.  Past Surgical History  Procedure Laterality Date  . Tummy tuck      Family History  Problem Relation Age of Onset  . Breast cancer Mother   . Diabetes Maternal Grandmother     Social History  Substance Use Topics  . Smoking status: Never Smoker   . Smokeless tobacco: None  . Alcohol Use: No     Comment: occasionally    Allergies: No Known Allergies  Prescriptions prior to admission  Medication Sig Dispense Refill Last Dose  . metroNIDAZOLE (FLAGYL) 500 MG tablet Take 1 tablet (500 mg total) by mouth 2 (two) times daily. 14 tablet 0     Review of Systems  Constitutional: Negative for fever and chills.  Gastrointestinal: Positive for abdominal pain. Negative for nausea, vomiting, diarrhea and constipation.  Genitourinary: Positive for vaginal bleeding. Negative for dysuria, urgency and frequency.  Neurological: Positive for dizziness.   Physical Exam   Blood pressure 117/74, pulse 91, temperature 98 F (36.7 C), temperature source Oral, resp. rate 16, height  5\' 4"  (1.626 m), weight 86.637 kg (191 lb), last menstrual period 02/09/2015, SpO2 98 %.  Physical Exam  Nursing note and vitals reviewed. Constitutional: She is oriented to person, place, and time. She appears well-developed and well-nourished. No distress.  HENT:  Head: Normocephalic.  Cardiovascular: Normal rate.   Respiratory: Effort normal.  GI: Soft. There is no tenderness. There is no rebound.  Genitourinary:   External: no lesion Vagina: small amount of blood seen  Cervix: pink, smooth, no CMT Uterus: NSSC Adnexa: NT   Neurological: She is alert and oriented to person, place, and time.  Skin: Skin is warm and dry.  Psychiatric: She has a normal mood and affect.   Results for [name], Karen E (MRN [id number]) as of 06/14/2015 01:26  Ref. Range 06/06/2015 18:07 06/06/2015 18:37 06/06/2015 19:37 06/13/2015 23:51 06/14/2015 00:15  HCG, Beta Chain, Quant, S Latest Ref Range: <5 mIU/mL 6726 (H)    2733 (H)    Results for orders placed or performed during the hospital encounter of 06/13/15 (from the past 24 hour(s))  Urinalysis, Routine w reflex microscopic ([facility])     Status: Abnormal   Collection Time: 06/13/15 11:51 PM  Result Value Ref Range   Color, Urine YELLOW YELLOW   APPearance HAZY (A) CLEAR   Specific Gravity, Urine 1.025 1.005 - 1.030   pH 5.0 5.0 - 8.0   Glucose, UA NEGATIVE NEGATIVE mg/dL   Hgb urine dipstick LARGE (A) NEGATIVE   Bilirubin Urine NEGATIVE NEGATIVE   Ketones, ur >80 (A) NEGATIVE mg/dL  Protein, ur 30 (A) NEGATIVE mg/dL   Nitrite NEGATIVE NEGATIVE   Leukocytes, UA NEGATIVE NEGATIVE  Urine microscopic-add on     Status: Abnormal   Collection Time: 06/13/15 11:51 PM  Result Value Ref Range   Squamous Epithelial / LPF 0-5 (A) NONE SEEN   WBC, UA 0-5 0 - 5 WBC/hpf   RBC / HPF 6-30 0 - 5 RBC/hpf   Bacteria, UA RARE (A) NONE SEEN  CBC     Status: Abnormal   Collection Time: 06/14/15 12:15 AM  Result Value Ref Range   WBC 11.5 (H) 4.0 - 10.5  K/uL   RBC 4.47 3.87 - 5.11 MIL/uL   Hemoglobin 12.0 12.0 - 15.0 g/dL   HCT 19.1 47.8 - 29.5 %   MCV 81.9 78.0 - 100.0 fL   MCH 26.8 26.0 - 34.0 pg   MCHC 32.8 30.0 - 36.0 g/dL   RDW 62.1 30.8 - 65.7 %   Platelets 270 150 - 400 K/uL  hCG, quantitative, pregnancy     Status: Abnormal   Collection Time: 06/14/15 12:15 AM  Result Value Ref Range   hCG, Beta Chain, Quant, S 2733 (H) <5 mIU/mL   US Ob Transvaginal  06/14/2015  CLINICAL DATA:  Vaginal bleeding and first-trimester pregnancy. Pregnancy of unknown location EXAM: TRANSVAGINAL OB ULTRASOUND TECHNIQUE: Transvaginal ultrasound was performed for complete evaluation of the gestation as well as the maternal uterus, adnexal regions, and pelvic cul-de-sac. COMPARISON:  06/06/2015 FINDINGS: Intrauterine gestational sac: Present Yolk sac:  Present Embryo:  Not seen MSD: 22  mm   7 w   1  d Subchorionic hemorrhage: Intermittently seen subchronic hemorrhage around the then gestational sac. Maternal uterus/adnexae: No adnexal mass. Physiologic appearance of the ovaries. No free pelvic fluid. IMPRESSION: 1. Confirmed intrauterine gestational sac containing yolk sac. 2. 22 mm mean sac diameter with no visible embryo. Multiple sites of subchorionic hemorrhage around the thin gestational sac. Findings are suspicious but not yet definitive for failed pregnancy. Recommend follow-up US in 10-14 days for definitive diagnosis. This recommendation follows SRU consensus guidelines: Diagnostic Criteria for Nonviable Pregnancy Early in the First Trimester. Malva Limes Med 2013; 846:9629-52. Electronically Signed   By: Marnee Spring M.D.   On: 06/14/2015 01:38    MAU Course  Procedures  MDM  D/W patient at length that falling hormone levels are indicative SAB, but tissue still present that needs to pass. Counseled at length on options of expectant management v cytotec. Patient undecided at this time. She would like time to consider. Offered follow up for early next  week to consider cytotec if desired. Patient likes this plan.   Reviewed bleeding patterns and anticipatory guidance given.   Assessment and Plan   1. SAB (spontaneous abortion)    DC home Comfort measures reviewed  Bleeding precautions RX: vicodin PRN #20, ibuprofen  PRN #30, phenergan PRN #30  Return to MAU as needed FU with OB as planned  Follow-up Information    Follow up with [facility].   Specialty:  Obstetrics and Gynecology   Why:  [facility] 06/18/15 AT 2:00    Contact information:   [address] [place] [address] [phone]        Tawnya Crook 06/14/2015, 12:24 AM

## 2015-06-14 NOTE — MAU Note (Signed)
Pt c/o vaginal bleeding x2 days-much heavier today. States she has used an entire roll of toilet paper throughout the day. Has had some lower abdominal cramping-worse today-rates 7/10. Did not take anything for pain. Denies passing tissue, just occasional blood clot.

## 2015-06-18 ENCOUNTER — Encounter: Payer: Self-pay | Admitting: Family Medicine

## 2015-06-18 ENCOUNTER — Ambulatory Visit (INDEPENDENT_AMBULATORY_CARE_PROVIDER_SITE_OTHER): Payer: Self-pay | Admitting: Family Medicine

## 2015-06-18 VITALS — BP 121/77 | HR 76 | Ht 64.0 in | Wt 193.0 lb

## 2015-06-18 DIAGNOSIS — O039 Complete or unspecified spontaneous abortion without complication: Secondary | ICD-10-CM

## 2015-06-18 MED ORDER — MISOPROSTOL 200 MCG PO TABS
ORAL_TABLET | ORAL | Status: DC
Start: 2015-06-18 — End: 2015-07-02

## 2015-06-18 MED ORDER — OXYCODONE-ACETAMINOPHEN 5-325 MG PO TABS: 1.0000 | ORAL_TABLET | ORAL | Status: DC | PRN

## 2015-06-18 NOTE — Patient Instructions (Signed)
Return to care if you have bleeding > 1 pad in an hour, dizziness, lightheadedness, or you pass out.   You will be seen in 1.5 weeks to assure that you have passed the pregnancy.    Incomplete Miscarriage A miscarriage is the sudden loss of an unborn baby (fetus) before the 20th week of pregnancy. In an incomplete miscarriage, parts of the fetus or placenta (afterbirth) remain in the body.  Having a miscarriage can be an emotional experience. Talk with your health care provider about any questions you may have about miscarrying, the grieving process, and your future pregnancy plans. CAUSES   Problems with the fetal chromosomes that make it impossible for the baby to develop normally. Problems with the baby's genes or chromosomes are most often the result of errors that occur by chance as the embryo divides and grows. The problems are not inherited from the parents.  Infection of the cervix or uterus.  Hormone problems.  Problems with the cervix, such as having an incompetent cervix. This is when the tissue in the cervix is not strong enough to hold the pregnancy.  Problems with the uterus, such as an abnormally shaped uterus, uterine fibroids, or congenital abnormalities.  Certain medical conditions.  Smoking, drinking alcohol, or taking illegal drugs.  Trauma. SYMPTOMS   Vaginal bleeding or spotting, with or without cramps or pain.  Pain or cramping in the abdomen or lower back.  Passing fluid, tissue, or blood clots from the vagina. DIAGNOSIS  Your health care provider will perform a physical exam. You may also have an ultrasound to confirm the miscarriage. Blood or urine tests may also be ordered. TREATMENT   Usually, a dilation and curettage (D&C) procedure is performed. During a D&C procedure, the cervix is widened (dilated) and any remaining fetal or placental tissue is gently removed from the uterus.  Antibiotic medicines are prescribed if there is an infection. Other  medicines may be given to reduce the size of the uterus (contract) if there is a lot of bleeding.  If you have Rh negative blood and your baby was Rh positive, you will need a Rho (D) immune globulin shot. This shot will protect any future baby from having Rh blood problems in future pregnancies.  You may be confined to bed rest. This means you should stay in bed and only get up to use the bathroom. HOME CARE INSTRUCTIONS   Rest as directed by your health care provider.  Restrict activity as directed by your health care provider. You may be allowed to continue light activity if curettage was not done but you require further treatment.  Keep track of the number of pads you use each day. Keep track of how soaked (saturated) they are. Record this information.  Do not  use tampons.  Do not douche or have sexual intercourse until approved by your health care provider.  Keep all follow-up appointments for reevaluation and continuing management.  Only take over-the-counter or prescription medicines for pain, fever, or discomfort as directed by your health care provider.  Take antibiotic medicine as directed by your health care provider. Make sure you finish it even if you start to feel better. SEEK IMMEDIATE MEDICAL CARE IF:   You experience severe cramps in your stomach, back, or abdomen.  You have an unexplained temperature (make sure to record these temperatures).  You pass large clots or tissue (save these for your health care provider to inspect).  Your bleeding increases.  You become light-headed, weak, or  have fainting episodes. MAKE SURE YOU:   Understand these instructions.  Will watch your condition.  Will get help right away if you are not doing well or get worse.   This information is not intended to replace advice given to you by your health care provider. Make sure you discuss any questions you have with your health care provider.   Document Released: 12/23/2004  Document Revised: 01/13/2014 Document Reviewed: 07/22/2012 Elsevier Interactive Patient Education [employer]2016 Elsevier Inc.

## 2015-06-18 NOTE — Progress Notes (Signed)
Patient ID: NASHLA ALTHOFF, female   DOB: 09/06/1980, 35 y.o.   MRN: [id number]   Follow up SAB Note  SUBJECTIVE HPI:  Ms. JANIKA JEDLICKA is a 35 y.o. [id number] at [redacted]w[redacted]d by LMP who presents to the [facility] for follow up SAB. The patient reports vaginal bleeding, worse for the past 24 hours, no passage for tissue. Reports she had some cramping which brought her to the MAU on 6/8 but this decreased but increased again in the last 24hrs.   No past medical history on file. Past Surgical History  Procedure Laterality Date  . Tummy tuck     Social History   Social History  . Marital Status: Married    Spouse Name: N/A  . Number of Children: N/A  . Years of Education: N/A   Occupational History  . Not on file.   Social History Main Topics  . Smoking status: Never Smoker   . Smokeless tobacco: Not on file  . Alcohol Use: No     Comment: occasionally  . Drug Use: No  . Sexual Activity: Yes   Other Topics Concern  . Not on file   Social History Narrative   Current Outpatient Prescriptions on File Prior to Visit  Medication Sig Dispense Refill  . ibuprofen (ADVIL,MOTRIN) 800 MG tablet Take 1 tablet (800 mg total) by mouth every 8 (eight) hours as needed. 30 tablet 0  . promethazine (PHENERGAN) 25 MG tablet Take 0.5-1 tablets (12.5-25 mg total) by mouth every 6 (six) hours as needed for nausea or vomiting. 30 tablet 0  . HYDROcodone-acetaminophen (NORCO/VICODIN) 5-325 MG tablet Take 1-2 tablets by mouth every 4 (four) hours as needed. (Patient not taking: Reported on 06/18/2015) 20 tablet 0   No current facility-administered medications on file prior to visit.   No Known Allergies  I have reviewed patient's Past Medical Hx, Surgical Hx, Family Hx, Social Hx, medications and allergies.   Review of Systems Review of Systems  Constitutional: Negative for fever and chills.  Gastrointestinal: Negative for nausea, vomiting, abdominal pain, diarrhea and constipation.   Genitourinary: Negative for dysuria.  Musculoskeletal: Negative for back pain.  Neurological: Negative for dizziness and weakness.    Physical Exam  BP 121/77 mmHg  Pulse 76  Ht 5\' 4"  (1.626 m)  Wt 193 lb (87.544 kg)  BMI 33.11 kg/m2  LMP 02/09/2015  GENERAL: Well-developed, well-nourished female in no acute distress.  HEENT: Normocephalic, atraumatic.   LUNGS: Effort normal ABDOMEN: soft, non-tender HEART: Regular rate  SKIN: Warm, dry and without erythema PSYCH: Normal mood and affect NEURO: Alert and oriented x 4  LAB RESULTS No results found for this or any previous visit (from the past 24 hour(s)).  IMAGING US Ob Comp Less 14 Wks  06/06/2015  CLINICAL DATA:  Right-sided abdominal discomfort EXAM: OBSTETRIC <14 WK [place] AND TRANSVAGINAL OB US TECHNIQUE: Both transabdominal and transvaginal ultrasound examinations were performed for complete evaluation of the gestation as well as the maternal uterus, adnexal regions, and pelvic cul-de-sac. Transvaginal technique was performed to assess early pregnancy. COMPARISON:  None. FINDINGS: There is an oval fluid collection within the endometrial canal with a mean diameter of 1.98 cm. There may be a subtle yolk sac seen anteriorly near the fundus within this fluid however, the presence of a yolk sac is uncertain. No embryo is identified. The ovaries are normal in appearance. Possible small subchorionic hemorrhage. No free fluid seen within the pelvis. No other abnormalities are identified. IMPRESSION: 1.  There is an oval collection of fluid in the endometrial canal. While there is suggestion of a possible yolk sac, this is not certain. As a result, this could represent a gestational sac or a pseudo gestational sac and the findings could represent early pregnancy, recent miscarriage, or ectopic pregnancy. Complicating the issue is the fact that the patient has irregular menstrual cycles with the last menstrual cycle in February of 2017 suggesting a  possible 16 week 5 day pregnancy. If the oval collection in the uterus is indeed a gestational sac, it would correlate with a gestational age of [redacted] weeks and 6 days. Recommend correlation with labs and follow-up imaging as clinically warranted. Electronically Signed   By: Gerome Samavid  Williams III M.D   On: 06/06/2015 19:52   [place] Ob Transvaginal  06/14/2015  CLINICAL DATA:  Vaginal bleeding and first-trimester pregnancy. Pregnancy of unknown location EXAM: TRANSVAGINAL OB ULTRASOUND TECHNIQUE: Transvaginal ultrasound was performed for complete evaluation of the gestation as well as the maternal uterus, adnexal regions, and pelvic cul-de-sac. COMPARISON:  06/06/2015 FINDINGS: Intrauterine gestational sac: Present Yolk sac:  Present Embryo:  Not seen MSD: 22  mm   7 w   1  d Subchorionic hemorrhage: Intermittently seen subchronic hemorrhage around the then gestational sac. Maternal uterus/adnexae: No adnexal mass. Physiologic appearance of the ovaries. No free pelvic fluid. IMPRESSION: 1. Confirmed intrauterine gestational sac containing yolk sac. 2. 22 mm mean sac diameter with no visible embryo. Multiple sites of subchorionic hemorrhage around the thin gestational sac. Findings are suspicious but not yet definitive for failed pregnancy. Recommend follow-up US in 10-14 days for definitive diagnosis. This recommendation follows SRU consensus guidelines: Diagnostic Criteria for Nonviable Pregnancy Early in the First Trimester. Malva Limes Engl J Med 2013; 161:0960-45; 369:1443-51. Electronically Signed   By: Marnee SpringJonathon  Watts M.D.   On: 06/14/2015 01:38   [place] Ob Transvaginal  06/06/2015  CLINICAL DATA:  Right-sided abdominal discomfort EXAM: OBSTETRIC <14 WK US AND TRANSVAGINAL OB US TECHNIQUE: Both transabdominal and transvaginal ultrasound examinations were performed for complete evaluation of the gestation as well as the maternal uterus, adnexal regions, and pelvic cul-de-sac. Transvaginal technique was performed to assess early pregnancy.  COMPARISON:  None. FINDINGS: There is an oval fluid collection within the endometrial canal with a mean diameter of 1.98 cm. There may be a subtle yolk sac seen anteriorly near the fundus within this fluid however, the presence of a yolk sac is uncertain. No embryo is identified. The ovaries are normal in appearance. Possible small subchorionic hemorrhage. No free fluid seen within the pelvis. No other abnormalities are identified. IMPRESSION: 1. There is an oval collection of fluid in the endometrial canal. While there is suggestion of a possible yolk sac, this is not certain. As a result, this could represent a gestational sac or a pseudo gestational sac and the findings could represent early pregnancy, recent miscarriage, or ectopic pregnancy. Complicating the issue is the fact that the patient has irregular menstrual cycles with the last menstrual cycle in February of 2017 suggesting a possible 16 week 5 day pregnancy. If the oval collection in the uterus is indeed a gestational sac, it would correlate with a gestational age of [redacted] weeks and 6 days. Recommend correlation with labs and follow-up imaging as clinically warranted. Electronically Signed   By: Gerome Samavid  Williams III M.D   On: 06/06/2015 19:52   Lab Results  Component Value Date   HCGBETAQNT 2733* 06/14/2015   HCGBETAQNT 6726* 06/06/2015   ASSESSMENT 1. Miscarriage  PLAN 1. Miscarriage-- evidence of falling beta-hcg level.  -Provided support to patient - misoprostol (CYTOTEC) 200 MCG tablet; Insert three tablets vaginally. You make repeat x 1 if you are not passing tissue  Dispense: 6 tablet; Refill: 0 - oxyCODONE-acetaminophen (ROXICET) 5-325 MG tablet; Take 1 tablet by mouth every 4 (four) hours as needed for severe pain.  Dispense: 4 tablet; Refill: 0 - RTC in 1-2 weeks for follow up exam and possibly [place] to assure passage of pregnancy.   Future Appointments Date Time Provider Department Center  07/02/2015 2:45 PM Kathrynn Running, MD  [facility]    Federico Flake, MD  06/18/2015  3:20 PM

## 2015-06-21 ENCOUNTER — Encounter (HOSPITAL_COMMUNITY): Payer: Self-pay | Admitting: *Deleted

## 2015-06-21 ENCOUNTER — Inpatient Hospital Stay (HOSPITAL_COMMUNITY): Payer: Self-pay

## 2015-06-21 ENCOUNTER — Inpatient Hospital Stay (HOSPITAL_COMMUNITY)
Admission: AD | Admit: 2015-06-21 | Discharge: 2015-06-21 | Disposition: A | Discharge status: Home / Self Care | Payer: Self-pay | Source: Ambulatory Visit | Attending: Obstetrics & Gynecology | Admitting: Obstetrics & Gynecology

## 2015-06-21 DIAGNOSIS — R102 Pelvic and perineal pain: Secondary | ICD-10-CM | POA: Insufficient documentation

## 2015-06-21 DIAGNOSIS — Z3A09 9 weeks gestation of pregnancy: Secondary | ICD-10-CM | POA: Insufficient documentation

## 2015-06-21 DIAGNOSIS — O021 Missed abortion: Secondary | ICD-10-CM | POA: Insufficient documentation

## 2015-06-21 LAB — COMPREHENSIVE METABOLIC PANEL
ALBUMIN: 3.4 g/dL — AB (ref 3.5–5.0)
ALK PHOS: 46 U/L (ref 38–126)
ALT: 62 U/L — ABNORMAL HIGH (ref 14–54)
ANION GAP: 6 (ref 5–15)
AST: 61 U/L — ABNORMAL HIGH (ref 15–41)
BILIRUBIN TOTAL: 0.5 mg/dL (ref 0.3–1.2)
BUN: 12 mg/dL (ref 6–20)
CALCIUM: 8.1 mg/dL — AB (ref 8.9–10.3)
CO2: 23 mmol/L (ref 22–32)
Chloride: 105 mmol/L (ref 101–111)
Creatinine, Ser: 0.51 mg/dL (ref 0.44–1.00)
GLUCOSE: 144 mg/dL — AB (ref 65–99)
Potassium: 3.6 mmol/L (ref 3.5–5.1)
Sodium: 134 mmol/L — ABNORMAL LOW (ref 135–145)
TOTAL PROTEIN: 6.2 g/dL — AB (ref 6.5–8.1)

## 2015-06-21 LAB — CBC
HEMATOCRIT: 29 % — AB (ref 36.0–46.0)
Hemoglobin: 9.5 g/dL — ABNORMAL LOW (ref 12.0–15.0)
MCH: 26.5 pg (ref 26.0–34.0)
MCHC: 32.8 g/dL (ref 30.0–36.0)
MCV: 80.8 fL (ref 78.0–100.0)
PLATELETS: 216 10*3/uL (ref 150–400)
RBC: 3.59 MIL/uL — ABNORMAL LOW (ref 3.87–5.11)
RDW: 14.7 % (ref 11.5–15.5)
WBC: 8.7 10*3/uL (ref 4.0–10.5)

## 2015-06-21 LAB — HCG, QUANTITATIVE, PREGNANCY: hCG, Beta Chain, Quant, S: 1004 m[IU]/mL — ABNORMAL HIGH (ref <5)

## 2015-06-21 MED ORDER — HYDROMORPHONE HCL 1 MG/ML IJ SOLN
INTRAMUSCULAR | Status: AC
  Filled 2015-06-21: qty 1

## 2015-06-21 MED ORDER — HYDROMORPHONE HCL 1 MG/ML IJ SOLN
1.0000 mg | Freq: Once | INTRAMUSCULAR | Status: DC
  Filled 2015-06-21: qty 1

## 2015-06-21 MED ORDER — HYDROMORPHONE HCL 1 MG/ML IJ SOLN
0.5000 mg | Freq: Once | INTRAMUSCULAR | Status: AC
  Administered 2015-06-21: 0.5 mg via INTRAMUSCULAR

## 2015-06-21 MED ORDER — HYDROMORPHONE HCL 1 MG/ML IJ SOLN
1.0000 mg | Freq: Once | INTRAMUSCULAR | Status: AC
  Administered 2015-06-21: 1 mg via INTRAMUSCULAR

## 2015-06-21 MED ORDER — MISOPROSTOL 200 MCG PO TABS
800.0000 ug | ORAL_TABLET | Freq: Once | ORAL | Status: AC
  Administered 2015-06-21: 800 ug via VAGINAL
  Filled 2015-06-21: qty 4

## 2015-06-21 NOTE — Discharge Instructions (Signed)
Miscarriage  A miscarriage is the sudden loss of an unborn baby (fetus) before the 20th week of pregnancy. Most miscarriages happen in the first 3 months of pregnancy. Sometimes, it happens before a woman even knows she is pregnant. A miscarriage is also called a "spontaneous miscarriage" or "early pregnancy loss." Having a miscarriage can be an emotional experience. Talk with your caregiver about any questions you may have about miscarrying, the grieving process, and your future pregnancy plans.  CAUSES    Problems with the fetal chromosomes that make it impossible for the baby to develop normally. Problems with the baby's genes or chromosomes are most often the result of errors that occur, by chance, as the embryo divides and grows. The problems are not inherited from the parents.   Infection of the cervix or uterus.    Hormone problems.    Problems with the cervix, such as having an incompetent cervix. This is when the tissue in the cervix is not strong enough to hold the pregnancy.    Problems with the uterus, such as an abnormally shaped uterus, uterine fibroids, or congenital abnormalities.    Certain medical conditions.    Smoking, drinking alcohol, or taking illegal drugs.    Trauma.   Often, the cause of a miscarriage is unknown.   SYMPTOMS    Vaginal bleeding or spotting, with or without cramps or pain.   Pain or cramping in the abdomen or lower back.   Passing fluid, tissue, or blood clots from the vagina.  DIAGNOSIS   Your caregiver will perform a physical exam. You may also have an ultrasound to confirm the miscarriage. Blood or urine tests may also be ordered.  TREATMENT    Sometimes, treatment is not necessary if you naturally pass all the fetal tissue that was in the uterus. If some of the fetus or placenta remains in the body (incomplete miscarriage), tissue left behind may become infected and must be removed. Usually, a dilation and curettage (D and C) procedure is performed.  During a D and C procedure, the cervix is widened (dilated) and any remaining fetal or placental tissue is gently removed from the uterus.   Antibiotic medicines are prescribed if there is an infection. Other medicines may be given to reduce the size of the uterus (contract) if there is a lot of bleeding.   If you have Rh negative blood and your baby was Rh positive, you will need a Rh immunoglobulin shot. This shot will protect any future baby from having Rh blood problems in future pregnancies.  HOME CARE INSTRUCTIONS    Your caregiver may order bed rest or may allow you to continue light activity. Resume activity as directed by your caregiver.   Have someone help with home and family responsibilities during this time.    Keep track of the number of sanitary pads you use each day and how soaked (saturated) they are. Write down this information.    Do not use tampons. Do not douche or have sexual intercourse until approved by your caregiver.    Only take over-the-counter or prescription medicines for pain or discomfort as directed by your caregiver.    Do not take aspirin. Aspirin can cause bleeding.    Keep all follow-up appointments with your caregiver.    If you or your partner have problems with grieving, talk to your caregiver or seek counseling to help cope with the pregnancy loss. Allow enough time to grieve before trying to get pregnant again.     SEEK IMMEDIATE MEDICAL CARE IF:    You have severe cramps or pain in your back or abdomen.   You have a fever.   You pass large blood clots (walnut-sized or larger) ortissue from your vagina. Save any tissue for your caregiver to inspect.    Your bleeding increases.    You have a thick, bad-smelling vaginal discharge.   You become lightheaded, weak, or you faint.    You have chills.   MAKE SURE YOU:   Understand these instructions.   Will watch your condition.   Will get help right away if you are not doing well or get worse.     This  information is not intended to replace advice given to you by your health care provider. Make sure you discuss any questions you have with your health care provider.     Document Released: 06/18/2000 Document Revised: 04/19/2012 Document Reviewed: 02/11/2011  Elsevier Interactive Patient Education 2016 [employer]

## 2015-06-21 NOTE — MAU Note (Signed)
Assumed care of patient.

## 2015-06-21 NOTE — MAU Provider Note (Signed)
History     CSN: [id number]  Arrival date and time: 06/21/15 4098   First Provider Initiated Contact with Patient 06/21/15 0043      Chief Complaint  Patient presents with  . Vaginal Bleeding   HPI Comments: [name] is a 35 y.o. [phone] at [redacted]w[redacted]d who presents today with vaignal bleeding. She was given cytotec and took 3 tabs vaginally on Tuesday evening. Soon after she started to bleed heavily and pass clots and possible tissue. Today she has continued to have heavy bleeding and pain. She is also feeling dizzy.   Vaginal Bleeding The patient's primary symptoms include pelvic pain and vaginal bleeding. This is a new problem. The current episode started yesterday. The problem occurs constantly. The problem has been gradually worsening. Pain severity now: 10/10  The problem affects both sides. She is pregnant. Associated symptoms include abdominal pain. Pertinent negatives include no chills, fever, headaches, nausea or vomiting. The vaginal discharge was bloody. The vaginal bleeding is heavier than menses. She has been passing clots. She has been passing tissue.    No past medical history on file.  Past Surgical History  Procedure Laterality Date  . Tummy tuck      Family History  Problem Relation Age of Onset  . Breast cancer Mother   . Diabetes Maternal Grandmother     Social History  Substance Use Topics  . Smoking status: Never Smoker   . Smokeless tobacco: Not on file  . Alcohol Use: No     Comment: occasionally    Allergies: No Known Allergies  Prescriptions prior to admission  Medication Sig Dispense Refill Last Dose  . HYDROcodone-acetaminophen (NORCO/VICODIN) 5-325 MG tablet Take 1-2 tablets by mouth every 4 (four) hours as needed. (Patient not taking: Reported on 06/18/2015) 20 tablet 0 Not Taking  . ibuprofen (ADVIL,MOTRIN) 800 MG tablet Take 1 tablet (800 mg total) by mouth every 8 (eight) hours as needed. 30 tablet 0 Taking  . misoprostol (CYTOTEC) 200  MCG tablet Insert three tablets vaginally. You make repeat x 1 if you are not passing tissue 6 tablet 0   . oxyCODONE-acetaminophen (ROXICET) 5-325 MG tablet Take 1 tablet by mouth every 4 (four) hours as needed for severe pain. 4 tablet 0   . promethazine (PHENERGAN) 25 MG tablet Take 0.5-1 tablets (12.5-25 mg total) by mouth every 6 (six) hours as needed for nausea or vomiting. 30 tablet 0 Taking    Review of Systems  Constitutional: Negative for fever and chills.  Gastrointestinal: Positive for abdominal pain. Negative for nausea and vomiting.  Genitourinary: Positive for vaginal bleeding and pelvic pain.  Neurological: Positive for dizziness. Negative for headaches.   Physical Exam   Blood pressure 99/47, pulse 82, resp. rate 20, last menstrual period 02/09/2015, SpO2 100 %.  Physical Exam  Nursing note and vitals reviewed. Constitutional: She is oriented to person, place, and time. She appears well-developed and well-nourished. No distress.  HENT:  Head: Normocephalic.  Cardiovascular: Normal rate.   Respiratory: Effort normal.  GI: Soft. There is no tenderness. There is no rebound.  Genitourinary:  External: no lesion Vagina: moderate amount of blood seen  Cervix: pink, smooth, small clot at the external. Removed easily.     Neurological: She is alert and oriented to person, place, and time.  Skin: Skin is warm and dry.  Psychiatric: She has a normal mood and affect.   Results for orders placed or performed during the hospital encounter of 06/21/15 (from the past  24 hour(s))  CBC     Status: Abnormal   Collection Time: 06/21/15 12:57 AM  Result Value Ref Range   WBC 8.7 4.0 - 10.5 K/uL   RBC 3.59 (L) 3.87 - 5.11 MIL/uL   Hemoglobin 9.5 (L) 12.0 - 15.0 g/dL   HCT 69.6 (L) 29.5 - 28.4 %   MCV 80.8 78.0 - 100.0 fL   MCH 26.5 26.0 - 34.0 pg   MCHC 32.8 30.0 - 36.0 g/dL   RDW 13.2 44.0 - 10.2 %   Platelets 216 150 - 400 K/uL  hCG, quantitative, pregnancy     Status:  Abnormal   Collection Time: 06/21/15 12:57 AM  Result Value Ref Range   hCG, Beta Chain, Quant, S 1004 (H) <5 mIU/mL  Comprehensive metabolic panel     Status: Abnormal   Collection Time: 06/21/15 12:57 AM  Result Value Ref Range   Sodium 134 (L) 135 - 145 mmol/L   Potassium 3.6 3.5 - 5.1 mmol/L   Chloride 105 101 - 111 mmol/L   CO2 23 22 - 32 mmol/L   Glucose, Bld 144 (H) 65 - 99 mg/dL   BUN 12 6 - 20 mg/dL   Creatinine, Ser 7.25 0.44 - 1.00 mg/dL   Calcium 8.1 (L) 8.9 - 10.3 mg/dL   Total Protein 6.2 (L) 6.5 - 8.1 g/dL   Albumin 3.4 (L) 3.5 - 5.0 g/dL   AST 61 (H) 15 - 41 U/L   ALT 62 (H) 14 - 54 U/L   Alkaline Phosphatase 46 38 - 126 U/L   Total Bilirubin 0.5 0.3 - 1.2 mg/dL   GFR calc non Af Amer >60 >60 mL/min   GFR calc Af Amer >60 >60 mL/min   Anion gap 6 5 - 15   US Ob Transvaginal  06/21/2015  CLINICAL DATA:  Follow-up bleeding post cited tech. Estimated gestational age by LMP is 18 weeks 6 days. Quantitative beta HCG is pending but was 6,726 on 05/31 and 2,733 on 06/08. EXAM: TRANSVAGINAL OB ULTRASOUND TECHNIQUE: Transvaginal ultrasound was performed for complete evaluation of the gestation as well as the maternal uterus, adnexal regions, and pelvic cul-de-sac. COMPARISON:  06/14/2015 FINDINGS: Intrauterine gestational sac: An ovoid intrauterine gestational sac is demonstrated extending into the lower uterine segment. Gestational sac is progressing inferiorly since the previous study. Yolk sac:  The yolk sac is identified. Embryo:  No fetal pole is visualized. Cardiac Activity: Not identified. MSD: 26.3  mm   7 w   4  d Subchorionic hemorrhage:  None visualized. Maternal uterus/adnexae: Heterogeneous endometrial contents are demonstrated above the gestational sac suggesting hemorrhage. No myometrial mass lesions identified. Ovaries are not visualized. No free fluid. IMPRESSION: Abnormal ovoid gestational sac is progressing inferiorly into the lower uterine segment. Yolk sac is  present but no fetal pole is identified. Endometrial hemorrhage. Findings suggest progressing loss of pregnancy. Electronically Signed   By: Burman Nieves M.D.   On: 06/21/2015 01:44    MAU Course  Procedures  MDM 0235: D/W Dr. Despina Hidden, will give of cytotec here, and more pain medication. Will continue to monitor.  0600: Patient's pain has been controlled. Bleeding is stable at this time. She has not passed tissue. Will DC home. Patient has pain medication at home to take as needed.   Assessment and Plan   1. Missed abortion    DC home Comfort measures reviewed   Bleeding precautions RX: none  Return to MAU as needed FU with OB as planned  Follow-up Information    [facility]   Specialty:  Obstetrics and Gynecology   Why:  As scheduled   Contact information:   [address] [place] [id number] [phone]        Tawnya CrookHogan, Croy Drumwright Donovan 06/21/2015, 12:45 AM

## 2015-06-21 NOTE — MAU Note (Signed)
Pt told she did not have a viable pregnancy-given cytotec rx and took at 5pm. States vaginal bleeding is extremely heavy-"pouring out like water" and having abdominal pain. Was given percocet to take-took 1 pill two hours. Not helping. Rates 10/10

## 2015-07-02 ENCOUNTER — Ambulatory Visit (INDEPENDENT_AMBULATORY_CARE_PROVIDER_SITE_OTHER): Payer: Self-pay | Admitting: Obstetrics and Gynecology

## 2015-07-02 ENCOUNTER — Encounter: Payer: Self-pay | Admitting: Obstetrics and Gynecology

## 2015-07-02 VITALS — BP 121/66 | HR 89 | Temp 98.8°F | Wt 191.4 lb

## 2015-07-02 DIAGNOSIS — O039 Complete or unspecified spontaneous abortion without complication: Secondary | ICD-10-CM

## 2015-07-02 LAB — CBC
HCT: 27 % — ABNORMAL LOW (ref 35.0–45.0)
Hemoglobin: 8.5 g/dL — ABNORMAL LOW (ref 11.7–15.5)
MCH: 26.2 pg — AB (ref 27.0–33.0)
MCHC: 31.5 g/dL — AB (ref 32.0–36.0)
MCV: 83.1 fL (ref 80.0–100.0)
MPV: 9.5 fL (ref 7.5–12.5)
PLATELETS: 349 10*3/uL (ref 140–400)
RBC: 3.25 MIL/uL — ABNORMAL LOW (ref 3.80–5.10)
RDW: 15 % (ref 11.0–15.0)
WBC: 6.9 10*3/uL (ref 3.8–10.8)

## 2015-07-02 NOTE — Progress Notes (Signed)
CLINIC ENCOUNTER NOTE  History:  35 y.o. [id number] here today for sab f/u.  Bleeding began ~6/6. Went to our [facility], diagnosed w/ sab. Rh positive. hcg level 2733 from 6726 previously. F/u here on 6/12 and told having miscarriage. Prescribed cytotec 600 vaginally. Patient took but they came out when clots came out that same day. On 6/18 went to the [facility] with heavy bleeding. hcg 1004, hemoglobin 9.5 from 12 on 6/8. Given cytotec. US showed incomplete sab. Hasn't passed tissue. Having some cramping. No fevers or n/v. When wipes a little bit of blood, but not wearing a pad, no real bleeding.   Past Medical History  Diagnosis Date  . Medical history non-contributory     Past Surgical History  Procedure Laterality Date  . Tummy tuck      The following portions of the patient's history were reviewed and updated as appropriate: allergies, current medications, past family history, past medical history, past social history, past surgical history and problem list.   Health Maintenance: cin 1 on colpo in December, needs repeat pap w/ co-test in one year.  Review of Systems:  See above; comprehensive review of systems was otherwise negative.  Objective:  Physical Exam BP 121/66 mmHg  Pulse 89  Temp(Src) 98.8 F (37.1 C)  Wt 191 lb 6.4 oz (86.818 kg)  LMP 02/09/2015  Breastfeeding? Unknown CONSTITUTIONAL: Well-developed, well-nourished female in no acute distress.  HENT:  Normocephalic, atraumatic SKIN: Skin is warm and dry.  NEUROLGIC: Alert  PSYCHIATRIC: Normal mood and affect.  CARDIOVASCULAR: Normal heart rate noted RESPIRATORY: Effort and breath sounds normal, no problems with respiration noted ABDOMEN: Soft, no distention noted.  No tenderness, rebound or guarding.  PELVIC: Deferred   Labs and Imaging US Ob Comp Less 14 Wks  06/06/2015  CLINICAL DATA:  Right-sided abdominal discomfort EXAM: OBSTETRIC <14 WK [place] AND TRANSVAGINAL OB US TECHNIQUE: Both transabdominal and transvaginal  ultrasound examinations were performed for complete evaluation of the gestation as well as the maternal uterus, adnexal regions, and pelvic cul-de-sac. Transvaginal technique was performed to assess early pregnancy. COMPARISON:  None. FINDINGS: There is an oval fluid collection within the endometrial canal with a mean diameter of 1.98 cm. There may be a subtle yolk sac seen anteriorly near the fundus within this fluid however, the presence of a yolk sac is uncertain. No embryo is identified. The ovaries are normal in appearance. Possible small subchorionic hemorrhage. No free fluid seen within the pelvis. No other abnormalities are identified. IMPRESSION: 1. There is an oval collection of fluid in the endometrial canal. While there is suggestion of a possible yolk sac, this is not certain. As a result, this could represent a gestational sac or a pseudo gestational sac and the findings could represent early pregnancy, recent miscarriage, or ectopic pregnancy. Complicating the issue is the fact that the patient has irregular menstrual cycles with the last menstrual cycle in February of 2017 suggesting a possible 16 week 5 day pregnancy. If the oval collection in the uterus is indeed a gestational sac, it would correlate with a gestational age of [redacted] weeks and 6 days. Recommend correlation with labs and follow-up imaging as clinically warranted. Electronically Signed   By: Gerome Sam III M.D   On: 06/06/2015 19:52   US Ob Transvaginal  06/21/2015  CLINICAL DATA:  Follow-up bleeding post cited tech. Estimated gestational age by LMP is 18 weeks 6 days. Quantitative beta HCG is pending but was 6,726 on 05/31 and 2,733 on 06/08. EXAM:  TRANSVAGINAL OB ULTRASOUND TECHNIQUE: Transvaginal ultrasound was performed for complete evaluation of the gestation as well as the maternal uterus, adnexal regions, and pelvic cul-de-sac. COMPARISON:  06/14/2015 FINDINGS: Intrauterine gestational sac: An ovoid intrauterine gestational  sac is demonstrated extending into the lower uterine segment. Gestational sac is progressing inferiorly since the previous study. Yolk sac:  The yolk sac is identified. Embryo:  No fetal pole is visualized. Cardiac Activity: Not identified. MSD: 26.3  mm   7 w   4  d Subchorionic hemorrhage:  None visualized. Maternal uterus/adnexae: Heterogeneous endometrial contents are demonstrated above the gestational sac suggesting hemorrhage. No myometrial mass lesions identified. Ovaries are not visualized. No free fluid. IMPRESSION: Abnormal ovoid gestational sac is progressing inferiorly into the lower uterine segment. Yolk sac is present but no fetal pole is identified. Endometrial hemorrhage. Findings suggest progressing loss of pregnancy. Electronically Signed   By: Burman NievesWilliam  Stevens M.D.   On: 06/21/2015 01:44   [place] Ob Transvaginal  06/14/2015  CLINICAL DATA:  Vaginal bleeding and first-trimester pregnancy. Pregnancy of unknown location EXAM: TRANSVAGINAL OB ULTRASOUND TECHNIQUE: Transvaginal ultrasound was performed for complete evaluation of the gestation as well as the maternal uterus, adnexal regions, and pelvic cul-de-sac. COMPARISON:  06/06/2015 FINDINGS: Intrauterine gestational sac: Present Yolk sac:  Present Embryo:  Not seen MSD: 22  mm   7 w   1  d Subchorionic hemorrhage: Intermittently seen subchronic hemorrhage around the then gestational sac. Maternal uterus/adnexae: No adnexal mass. Physiologic appearance of the ovaries. No free pelvic fluid. IMPRESSION: 1. Confirmed intrauterine gestational sac containing yolk sac. 2. 22 mm mean sac diameter with no visible embryo. Multiple sites of subchorionic hemorrhage around the thin gestational sac. Findings are suspicious but not yet definitive for failed pregnancy. Recommend follow-up US in 10-14 days for definitive diagnosis. This recommendation follows SRU consensus guidelines: Diagnostic Criteria for Nonviable Pregnancy Early in the First Trimester. Malva Limes Engl J  Med 2013; 295:6213-08; 369:1443-51. Electronically Signed   By: Marnee SpringJonathon  Watts M.D.   On: 06/14/2015 01:38   [place] Ob Transvaginal  06/06/2015  CLINICAL DATA:  Right-sided abdominal discomfort EXAM: OBSTETRIC <14 WK US AND TRANSVAGINAL OB US TECHNIQUE: Both transabdominal and transvaginal ultrasound examinations were performed for complete evaluation of the gestation as well as the maternal uterus, adnexal regions, and pelvic cul-de-sac. Transvaginal technique was performed to assess early pregnancy. COMPARISON:  None. FINDINGS: There is an oval fluid collection within the endometrial canal with a mean diameter of 1.98 cm. There may be a subtle yolk sac seen anteriorly near the fundus within this fluid however, the presence of a yolk sac is uncertain. No embryo is identified. The ovaries are normal in appearance. Possible small subchorionic hemorrhage. No free fluid seen within the pelvis. No other abnormalities are identified. IMPRESSION: 1. There is an oval collection of fluid in the endometrial canal. While there is suggestion of a possible yolk sac, this is not certain. As a result, this could represent a gestational sac or a pseudo gestational sac and the findings could represent early pregnancy, recent miscarriage, or ectopic pregnancy. Complicating the issue is the fact that the patient has irregular menstrual cycles with the last menstrual cycle in February of 2017 suggesting a possible 16 week 5 day pregnancy. If the oval collection in the uterus is indeed a gestational sac, it would correlate with a gestational age of [redacted] weeks and 6 days. Recommend correlation with labs and follow-up imaging as clinically warranted. Electronically Signed   By: Gerome Samavid  Williams  III M.D   On: 06/06/2015 19:52    Assessment & Plan:   # SAB, possibly incomplete - clinically appears complete, but on most recent u/s sac seen in LUS, and patient has not passed much of anything since that u/s. If retained products will have failed medical  mgmt. Discussed w/ Dr. Vergie LivingPickens. - repeat hcg and cbc today - ultrasound - f/u according to results of above studies - bleeding and infection ED precautions discussed  Routine preventative health maintenance measures emphasized.     Noah B. Wouk, MD OB/GYN Fellow Center for [facility], [facility]

## 2015-07-03 LAB — HCG, QUANTITATIVE, PREGNANCY: HCG, BETA CHAIN, QUANT, S: 850.5 m[IU]/mL — AB

## 2015-07-04 ENCOUNTER — Ambulatory Visit (HOSPITAL_COMMUNITY)
Admission: RE | Admit: 2015-07-04 | Discharge: 2015-07-04 | Disposition: A | Discharge status: Home / Self Care | Payer: Self-pay | Source: Ambulatory Visit | Attending: Obstetrics and Gynecology | Admitting: Obstetrics and Gynecology

## 2015-07-04 DIAGNOSIS — O034 Incomplete spontaneous abortion without complication: Secondary | ICD-10-CM | POA: Insufficient documentation

## 2015-07-04 DIAGNOSIS — O039 Complete or unspecified spontaneous abortion without complication: Secondary | ICD-10-CM

## 2015-07-05 ENCOUNTER — Encounter (HOSPITAL_COMMUNITY): Payer: Self-pay | Admitting: *Deleted

## 2015-07-05 ENCOUNTER — Telehealth: Payer: Self-pay | Admitting: Obstetrics and Gynecology

## 2015-07-05 NOTE — Telephone Encounter (Signed)
Incomplete sab. Failed medical mgmt. U/s confirms. Posted for d & e tomorrow morning 9:30. Called patient to inform. Npo past midnight.

## 2015-07-06 ENCOUNTER — Ambulatory Visit (HOSPITAL_COMMUNITY): Payer: Self-pay | Admitting: Anesthesiology

## 2015-07-06 ENCOUNTER — Encounter (HOSPITAL_COMMUNITY): Payer: Self-pay

## 2015-07-06 ENCOUNTER — Ambulatory Visit (HOSPITAL_COMMUNITY)
Admission: RE | Admit: 2015-07-06 | Discharge: 2015-07-06 | Disposition: A | Discharge status: Home / Self Care | Payer: Self-pay | Source: Ambulatory Visit | Attending: Obstetrics & Gynecology | Admitting: Obstetrics & Gynecology

## 2015-07-06 ENCOUNTER — Encounter (HOSPITAL_COMMUNITY)
Admission: RE | Disposition: A | Discharge status: Home / Self Care | Payer: Self-pay | Source: Ambulatory Visit | Attending: Obstetrics & Gynecology

## 2015-07-06 DIAGNOSIS — Z87891 Personal history of nicotine dependence: Secondary | ICD-10-CM | POA: Insufficient documentation

## 2015-07-06 DIAGNOSIS — O034 Incomplete spontaneous abortion without complication: Secondary | ICD-10-CM | POA: Insufficient documentation

## 2015-07-06 DIAGNOSIS — Z3A11 11 weeks gestation of pregnancy: Secondary | ICD-10-CM | POA: Insufficient documentation

## 2015-07-06 HISTORY — PX: DILATION AND CURETTAGE OF UTERUS: SHX78

## 2015-07-06 LAB — HCG, QUANTITATIVE, PREGNANCY: hCG, Beta Chain, Quant, S: 845 m[IU]/mL — ABNORMAL HIGH (ref <5)

## 2015-07-06 SURGERY — DILATION AND CURETTAGE
Anesthesia: Monitor Anesthesia Care

## 2015-07-06 MED ORDER — MIDAZOLAM HCL 2 MG/2ML IJ SOLN
INTRAMUSCULAR | Status: AC
  Filled 2015-07-06: qty 2

## 2015-07-06 MED ORDER — BUPIVACAINE HCL 0.5 % IJ SOLN
INTRAMUSCULAR | Status: DC | PRN
  Administered 2015-07-06: 30 mL

## 2015-07-06 MED ORDER — DEXAMETHASONE SODIUM PHOSPHATE 10 MG/ML IJ SOLN
INTRAMUSCULAR | Status: DC | PRN
  Administered 2015-07-06: 4 mg via INTRAVENOUS

## 2015-07-06 MED ORDER — HYDROCODONE-ACETAMINOPHEN 5-325 MG PO TABS: 1.0000 | ORAL_TABLET | ORAL | Status: DC | PRN

## 2015-07-06 MED ORDER — ONDANSETRON HCL 4 MG/2ML IJ SOLN
INTRAMUSCULAR | Status: AC
  Filled 2015-07-06: qty 2

## 2015-07-06 MED ORDER — DOXYCYCLINE HYCLATE 100 MG IV SOLR
200.0000 mg | INTRAVENOUS | Status: AC
  Administered 2015-07-06: 200 mg via INTRAVENOUS
  Filled 2015-07-06: qty 200

## 2015-07-06 MED ORDER — FLUMAZENIL 0.5 MG/5ML IV SOLN
INTRAVENOUS | Status: AC
  Filled 2015-07-06: qty 5

## 2015-07-06 MED ORDER — LACTATED RINGERS IV SOLN: INTRAVENOUS | Status: DC

## 2015-07-06 MED ORDER — PROPOFOL 500 MG/50ML IV EMUL
INTRAVENOUS | Status: DC | PRN
  Administered 2015-07-06: 20 mg via INTRAVENOUS
  Administered 2015-07-06: 60 mg via INTRAVENOUS
  Administered 2015-07-06: 20 mg via INTRAVENOUS
  Administered 2015-07-06: 10 mg via INTRAVENOUS
  Administered 2015-07-06: 20 mg via INTRAVENOUS
  Administered 2015-07-06: 10 mg via INTRAVENOUS
  Administered 2015-07-06: 20 mg via INTRAVENOUS
  Administered 2015-07-06: 10 mg via INTRAVENOUS
  Administered 2015-07-06 (×2): 40 mg via INTRAVENOUS
  Administered 2015-07-06: 10 mg via INTRAVENOUS
  Administered 2015-07-06: 20 mg via INTRAVENOUS

## 2015-07-06 MED ORDER — MIDAZOLAM HCL 2 MG/2ML IJ SOLN
INTRAMUSCULAR | Status: DC | PRN
  Administered 2015-07-06: 2 mg via INTRAVENOUS

## 2015-07-06 MED ORDER — FENTANYL CITRATE (PF) 100 MCG/2ML IJ SOLN
INTRAMUSCULAR | Status: AC
  Administered 2015-07-06: 50 ug via INTRAVENOUS
  Filled 2015-07-06: qty 2

## 2015-07-06 MED ORDER — KETOROLAC TROMETHAMINE 30 MG/ML IJ SOLN: 30.0000 mg | Freq: Once | INTRAMUSCULAR | Status: DC | PRN

## 2015-07-06 MED ORDER — FENTANYL CITRATE (PF) 100 MCG/2ML IJ SOLN
25.0000 ug | INTRAMUSCULAR | Status: DC | PRN
  Administered 2015-07-06: 50 ug via INTRAVENOUS

## 2015-07-06 MED ORDER — ONDANSETRON HCL 4 MG/2ML IJ SOLN
INTRAMUSCULAR | Status: DC | PRN
Start: 2015-07-06 — End: 2015-07-06
  Administered 2015-07-06: 4 mg via INTRAVENOUS

## 2015-07-06 MED ORDER — DEXAMETHASONE SODIUM PHOSPHATE 4 MG/ML IJ SOLN
INTRAMUSCULAR | Status: AC
  Filled 2015-07-06: qty 1

## 2015-07-06 MED ORDER — IBUPROFEN 800 MG PO TABS: 800.0000 mg | ORAL_TABLET | Freq: Three times a day (TID) | ORAL | Status: DC | PRN

## 2015-07-06 MED ORDER — FENTANYL CITRATE (PF) 100 MCG/2ML IJ SOLN
INTRAMUSCULAR | Status: AC
  Filled 2015-07-06: qty 2

## 2015-07-06 MED ORDER — PROMETHAZINE HCL 25 MG/ML IJ SOLN: 6.2500 mg | INTRAMUSCULAR | Status: DC | PRN

## 2015-07-06 MED ORDER — NORETHINDRONE-ETH ESTRADIOL 1-5 MG-MCG PO TABS: 1.0000 | ORAL_TABLET | Freq: Every day | ORAL | Status: DC

## 2015-07-06 MED ORDER — BUPIVACAINE HCL (PF) 0.5 % IJ SOLN
INTRAMUSCULAR | Status: AC
  Filled 2015-07-06: qty 30

## 2015-07-06 MED ORDER — KETOROLAC TROMETHAMINE 30 MG/ML IJ SOLN
INTRAMUSCULAR | Status: AC
  Filled 2015-07-06: qty 1

## 2015-07-06 MED ORDER — KETOROLAC TROMETHAMINE 30 MG/ML IJ SOLN
INTRAMUSCULAR | Status: DC | PRN
  Administered 2015-07-06: 30 mg via INTRAVENOUS

## 2015-07-06 MED ORDER — FENTANYL CITRATE (PF) 100 MCG/2ML IJ SOLN
INTRAMUSCULAR | Status: DC | PRN
  Administered 2015-07-06 (×3): 50 ug via INTRAVENOUS

## 2015-07-06 MED ORDER — SCOPOLAMINE 1 MG/3DAYS TD PT72
MEDICATED_PATCH | TRANSDERMAL | Status: AC
  Filled 2015-07-06: qty 1

## 2015-07-06 MED ORDER — PROPOFOL 10 MG/ML IV BOLUS
INTRAVENOUS | Status: AC
  Filled 2015-07-06: qty 20

## 2015-07-06 MED ORDER — HYDROMORPHONE HCL 1 MG/ML IJ SOLN: 0.2500 mg | INTRAMUSCULAR | Status: DC | PRN

## 2015-07-06 MED ORDER — DOCUSATE SODIUM 100 MG PO CAPS: 100.0000 mg | ORAL_CAPSULE | Freq: Two times a day (BID) | ORAL | Status: DC | PRN

## 2015-07-06 MED ORDER — LACTATED RINGERS IV SOLN
INTRAVENOUS | Status: DC
  Administered 2015-07-06: 09:00:00 via INTRAVENOUS

## 2015-07-06 MED ORDER — SCOPOLAMINE 1 MG/3DAYS TD PT72
1.0000 | MEDICATED_PATCH | Freq: Once | TRANSDERMAL | Status: DC
  Administered 2015-07-06: 1.5 mg via TRANSDERMAL

## 2015-07-06 MED ORDER — LIDOCAINE HCL (CARDIAC) 20 MG/ML IV SOLN
INTRAVENOUS | Status: DC | PRN
  Administered 2015-07-06: 30 mg via INTRAVENOUS
  Administered 2015-07-06: 70 mg via INTRAVENOUS

## 2015-07-06 MED ORDER — HYDROCODONE-ACETAMINOPHEN 7.5-325 MG PO TABS: 1.0000 | ORAL_TABLET | Freq: Once | ORAL | Status: DC | PRN

## 2015-07-06 MED ORDER — LIDOCAINE HCL (CARDIAC) 20 MG/ML IV SOLN
INTRAVENOUS | Status: AC
  Filled 2015-07-06: qty 5

## 2015-07-06 SURGICAL SUPPLY — 14 items
CATH ROBINSON RED A/P 16FR (CATHETERS) ×3 IMPLANT
CLOTH BEACON ORANGE TIMEOUT ST (SAFETY) ×3 IMPLANT
CONTAINER PREFILL 10% NBF 60ML (FORM) ×6 IMPLANT
DECANTER SPIKE VIAL GLASS SM (MISCELLANEOUS) ×3 IMPLANT
GLOVE BIOGEL PI IND STRL 7.0 (GLOVE) ×3 IMPLANT
GLOVE BIOGEL PI INDICATOR 7.0 (GLOVE) ×6
GLOVE ECLIPSE 7.0 STRL STRAW (GLOVE) ×3 IMPLANT
GOWN STRL REUS W/TWL LRG LVL3 (GOWN DISPOSABLE) ×6 IMPLANT
PACK VAGINAL MINOR WOMEN LF (CUSTOM PROCEDURE TRAY) ×3 IMPLANT
PAD OB MATERNITY 4.3X12.25 (PERSONAL CARE ITEMS) ×3 IMPLANT
PAD PREP 24X48 CUFFED NSTRL (MISCELLANEOUS) ×3 IMPLANT
TOWEL OR 17X24 6PK STRL BLUE (TOWEL DISPOSABLE) ×6 IMPLANT
VACURETTE 7MM CVD STRL WRAP (CANNULA) ×3 IMPLANT
WATER STERILE IRR 1000ML POUR (IV SOLUTION) ×3 IMPLANT

## 2015-07-06 NOTE — Anesthesia Preprocedure Evaluation (Signed)
Anesthesia Evaluation  Patient identified by MRN, date of birth, ID band Patient awake    Reviewed: Allergy & Precautions, H&P , Patient's Chart, lab work & pertinent test results, reviewed documented beta blocker date and time   Airway Mallampati: II  TM Distance: >3 FB Neck ROM: full    Dental no notable dental hx.    Pulmonary former smoker,    Pulmonary exam normal breath sounds clear to auscultation       Cardiovascular  Rhythm:regular Rate:Normal     Neuro/Psych    GI/Hepatic   Endo/Other    Renal/GU      Musculoskeletal   Abdominal   Peds  Hematology  (+) anemia ,   Anesthesia Other Findings   Reproductive/Obstetrics                             Anesthesia Physical Anesthesia Plan  ASA: II  Anesthesia Plan: MAC   Post-op Pain Management:    Induction: Intravenous  Airway Management Planned: Mask and Natural Airway  Additional Equipment:   Intra-op Plan:   Post-operative Plan:   Informed Consent: I have reviewed the patients History and Physical, chart, labs and discussed the procedure including the risks, benefits and alternatives for the proposed anesthesia with the patient or authorized representative who has indicated his/her understanding and acceptance.   Dental Advisory Given  Plan Discussed with: CRNA and Surgeon  Anesthesia Plan Comments: (Discussed sedation and potential to need to place airway or ETT if warranted by clinical changes intra-operatively. We will start procedure as MAC.)        Anesthesia Quick Evaluation

## 2015-07-06 NOTE — Anesthesia Postprocedure Evaluation (Signed)
Anesthesia Post Note  Patient: Minerva EndsMarseddez E Schwager  Procedure(s) Performed: Procedure(s) (LRB): DILATATION AND Evacuation (N/A)  Patient location during evaluation: PACU Anesthesia Type: MAC Level of consciousness: awake and alert Pain management: pain level controlled Vital Signs Assessment: post-procedure vital signs reviewed and stable Respiratory status: spontaneous breathing, nonlabored ventilation, respiratory function stable and patient connected to nasal cannula oxygen Cardiovascular status: stable and blood pressure returned to baseline Anesthetic complications: no     Last Vitals:  Filed Vitals:   07/06/15 1115 07/06/15 1154  BP: 106/69 103/67  Pulse: 66 65  Temp: 36.4 C   Resp: 16 20    Last Pain:  Filed Vitals:   07/06/15 1159  PainSc: 4    Pain Goal: Patients Stated Pain Goal: 3 (07/06/15 0824)               Kennieth RadFitzgerald, Alisia Vanengen E

## 2015-07-06 NOTE — Transfer of Care (Signed)
Immediate Anesthesia Transfer of Care Note  Patient: [name]  Procedure(s) Performed: Procedure(s): DILATATION AND Evacuation (N/A)  Patient Location: PACU  Anesthesia Type:MAC  Level of Consciousness: awake, alert , oriented and patient cooperative  Airway & Oxygen Therapy: Patient Spontanous Breathing and Patient connected to nasal cannula oxygen  Post-op Assessment: Report given to RN and Post -op Vital signs reviewed and stable  Post vital signs: Reviewed and stable  Last Vitals:  Filed Vitals:   07/06/15 0824  BP: 107/59  Pulse: 81  Temp: 36.4 C  Resp: 16    Last Pain: There were no vitals filed for this visit.    Patients Stated Pain Goal: 3 (07/06/15 [id number])  Complications: No apparent anesthesia complications

## 2015-07-06 NOTE — Discharge Instructions (Signed)
Dilation and Curettage or Vacuum Curettage, Care After °Refer to this sheet in the next few weeks. These instructions provide you with information on caring for yourself after your procedure. Your health care provider may also give you more specific instructions. Your treatment has been planned according to current medical practices, but problems sometimes occur. Call your health care provider if you have any problems or questions after your procedure. °WHAT TO EXPECT AFTER THE PROCEDURE °After your procedure, it is typical to have light cramping and bleeding. This may last for 2 days to 2 weeks after the procedure. °HOME CARE INSTRUCTIONS  °· Do not drive for 24 hours. °· Wait 1 week before returning to strenuous activities. °· Take your temperature 2 times a day for 4 days and write it down. Provide these temperatures to your health care provider if you develop a fever. °· Avoid long periods of standing. °· Avoid heavy lifting, pushing, or pulling. Do not lift anything heavier than 10 pounds (4.5 kg). °· Limit stair climbing to once or twice a day. °· Take rest periods often. °· You may resume your usual diet. °· Drink enough fluids to keep your urine clear or pale yellow. °· Your usual bowel function should return. If you have constipation, you may: °¨ Take a mild laxative with permission from your health care provider. °¨ Add fruit and bran to your diet. °¨ Drink more fluids. °· Take showers instead of baths until your health care provider gives you permission to take baths. °· Do not go swimming or use a hot tub until your health care provider approves. °· Try to have someone with you or available to you the first 24-48 hours, especially if you were given a general anesthetic. °· Do not douche, use tampons, or have sex (intercourse) for 2 weeks after the procedure. °· Only take over-the-counter or prescription medicines as directed by your health care provider. Do not take aspirin. It can cause  bleeding. °· Follow up with your health care provider as directed. °SEEK MEDICAL CARE IF:  °· You have increasing cramps or pain that is not relieved with medicine. °· You have abdominal pain that does not seem to be related to the same area of earlier cramping and pain. °· You have bad smelling vaginal discharge. °· You have a rash. °· You are having problems with any medicine. °SEEK IMMEDIATE MEDICAL CARE IF:  °· You have bleeding that is heavier than a normal menstrual period. °· You have a fever. °· You have chest pain. °· You have shortness of breath. °· You feel dizzy or feel like fainting. °· You pass out. °· You have pain in your shoulder strap area. °· You have heavy vaginal bleeding with or without blood clots. °MAKE SURE YOU:  °· Understand these instructions. °· Will watch your condition. °· Will get help right away if you are not doing well or get worse. °  °This information is not intended to replace advice given to you by your health care provider. Make sure you discuss any questions you have with your health care provider. °  °Document Released: 12/21/1999 Document Revised: 12/28/2012 Document Reviewed: 07/22/2012 °Elsevier Interactive Patient Education ©2016 [employer]vier [employer]. ° °Post Anesthesia Home Care Instructions ° °Activity: °Get plenty of rest for the remainder of the day. A responsible adult should stay with you for 24 hours following the procedure.  °For the next 24 hours, DO NOT: °-Drive a car °-Operate machinery °-Drink alcoholic beverages °-Take any medication unless   instructed by your physician °-Make any legal decisions or sign important papers. ° °Meals: °Start with liquid foods such as gelatin or soup. Progress to regular foods as tolerated. Avoid greasy, spicy, heavy foods. If nausea and/or vomiting occur, drink only clear liquids until the nausea and/or vomiting subsides. Call your physician if vomiting continues. ° °Special Instructions/Symptoms: °Your throat may feel dry or sore from  the anesthesia or the breathing tube placed in your throat during surgery. If this causes discomfort, gargle with warm salt water. The discomfort should disappear within 24 hours. ° °If you had a scopolamine patch placed behind your ear for the management of post- operative nausea and/or vomiting: ° °1. The medication in the patch is effective for 72 hours, after which it should be removed.  Wrap patch in a tissue and discard in the trash. Wash hands thoroughly with soap and water. °2. You may remove the patch earlier than 72 hours if you experience unpleasant side effects which may include dry mouth, dizziness or visual disturbances. °3. Avoid touching the patch. Wash your hands with soap and water after contact with the patch. °  ° °

## 2015-07-06 NOTE — Op Note (Signed)
Minerva EndsMarseddez E Mcartor PROCEDURE DATE: 07/06/2015  PREOPERATIVE DIAGNOSIS: Incomplete abortion in the first trimester POSTOPERATIVE DIAGNOSIS: The same PROCEDURE:     Dilation and Evacuation SURGEON:  Dr. Jaynie CollinsUgonna Lecretia Buczek ASSISTANT: Dr. Shonna ChockNoah Wouk  INDICATIONS: 35 y.o. [id number] with incomplete abortion despite two doses of misoprostol, needing surgical completion.  Risks of surgery were discussed with the patient including but not limited to: bleeding which may require transfusion; infection which may require antibiotics; injury to uterus or surrounding organs; need for additional procedures including laparotomy or laparoscopy; possibility of intrauterine scarring which may impair future fertility; and other postoperative/anesthesia complications. Written informed consent was obtained.    FINDINGS:  A 7 week size uterus, small amount of products of conception, specimen sent to pathology.  ANESTHESIA:    Monitored intravenous sedation, paracervical block with 30 ml of 0.5% Marcaine. INTRAVENOUS FLUIDS:  300 ml of LR ESTIMATED BLOOD LOSS: 15 ml. SPECIMENS:  Products of conception sent to pathology COMPLICATIONS:  None immediate.  PROCEDURE DETAILS:  The patient received intravenous Doxycycline while in the preoperative area.  She was then taken to the operating room where monitored intravenous sedation was administered and was found to be adequate.  After an adequate timeout was performed, she was placed in the dorsal lithotomy position and examined; then prepped and draped in the sterile manner.   Her bladder was catheterized for an unmeasured amount of clear, yellow urine. A vaginal speculum was then placed in the patient's vagina and a single tooth tenaculum was applied to the anterior lip of the cervix.  A paracervical block using 30 ml of 0.5% Marcaine was administered. The cervix was gently dilated to accommodate a 7 mm suction curette that was gently advanced to the uterine fundus.  The suction device  was then activated and curette slowly rotated to clear the uterus of products of conception.  Curettage was then performed to confirm complete emptying of the uterus. There was minimal bleeding noted and the tenaculum removed with good hemostasis noted.   All instruments were removed from the patient's vagina.  Sponge and instrument counts were correct times two  The patient tolerated the procedure well and was taken to the recovery area awake, and in stable condition.  The patient will be discharged to home as per PACU criteria.  Routine postoperative instructions given.  She was prescribed Percocet, Ibuprofen and Colace.  She will follow up in the clinic on 08/06/15 for postoperative evaluation.   Jaynie CollinsUGONNA  Seleny Allbright, MD, FACOG Attending Obstetrician & Gynecologist Faculty Practice, [facility]

## 2015-07-06 NOTE — H&P (Signed)
OB/GYN PREOPERATIVE HISTORY AND PHYSICAL NOTE  [name] is a 35 y.o. female [phone] presenting for d & e.  Bleeding began ~6/6. Went to our [facility], diagnosed w/ sab. Rh positive. hcg level 2733 from 6726 previously. F/u here on 6/12 and told having miscarriage. Prescribed cytotec 600 vaginally. Patient took but they came out when clots came out that same day. On 6/18 went to the mau with heavy bleeding. hcg 1004, hemoglobin 9.5 from 12 on 6/8. Given cytotec. [place] showed incomplete sab. Hasn't passed tissue or clot since taking cytotec. Having some cramping. And spotting. No fevers or n/v. On 6/26 in clinic h 8.5, still spotting, hcg 850, u/s showing 15 mm stripe with concern for possible retained products.    Past Medical History  Diagnosis Date  . Medical history non-contributory     Past Surgical History  Procedure Laterality Date  . Tummy tuck      OB History    Gravida Para Term Preterm AB TAB SAB Ectopic Multiple Living   5 3 3  1 1    3       Social History   Social History  . Marital Status: Divorced    Spouse Name: N/A  . Number of Children: N/A  . Years of Education: N/A   Social History Main Topics  . Smoking status: Former Smoker    Types: Cigarettes    Quit date: 07/05/2014  . Smokeless tobacco: Never Used  . Alcohol Use: No     Comment: occasionally  . Drug Use: No  . Sexual Activity: Yes    Birth Control/ Protection: None, Pill   Other Topics Concern  . None   Social History Narrative    Family History  Problem Relation Age of Onset  . Breast cancer Mother   . Diabetes Maternal Grandmother     Allergies: No Known Allergies  Prescriptions prior to admission  Medication Sig Dispense Refill Last Dose  . norethindrone-ethinyl estradiol (FEMHRT 1/5) 1-5 MG-MCG TABS tablet Take 1 tablet by mouth daily.   Past Week at Unknown time  . HYDROcodone-acetaminophen (NORCO/VICODIN) 5-325 MG tablet Take 1-2 tablets by mouth every 4 (four) hours as  needed. 20 tablet 0 Taking  . ibuprofen (ADVIL,MOTRIN) 800 MG tablet Take 1 tablet (800 mg total) by mouth every 8 (eight) hours as needed. 30 tablet 0 More than a month at Unknown time     Review of Systems   All systems reviewed and negative except as stated in HPI  Blood pressure 107/59, pulse 81, temperature 97.6 F (36.4 C), temperature source Oral, resp. rate 16, height 5\' 4"  (1.626 m), weight 191 lb (86.637 kg), last menstrual period 02/09/2015, SpO2 99 %, unknown if currently breastfeeding. General appearance: alert, cooperative and appears stated age Lungs: clear to auscultation bilaterally Heart: regular rate and rhythm Abdomen: soft, non-tender; bowel sounds normal Extremities: No calf swelling or tenderness  Prenatal labs: Rh negative   No results found for this or any previous visit (from the past 24 hour(s)).  Patient Active Problem List   Diagnosis Date Noted  . Miscarriage 06/18/2015  . Low grade squamous intraepithelial lesion (LGSIL) on Papanicolaou smear of cervix 12/22/2014    Assessment: [name] is a 35 y.o. [id number] at 5834w1d here for d & e for incomplete sab, failed medical mgmt.   Cherrie Gauzeoah B Wouk 07/06/2015, 8:55 AM   Attestation of Attending Supervision of Obstetric Fellow: Evaluation and management procedures were performed by the Obstetric Fellow under  my supervision and collaboration.  I have reviewed the Obstetric Fellow's note and chart, and I agree with the management and plan.  Patient was counseled regarding need for D&E.  Risks of surgery including bleeding, infection, injury to surrounding organs, need for additional procedures, possibility of intrauterine scarring which may impair future fertility, risk of retained products which may require further management and other postoperative/anesthesia complications were explained to patient.  Likelihood of success of complete evacuation of the uterus was discussed with the patient.  Written  informed consent was obtained.  Patient has been NPO since last night  she will remain NPO for procedure. Anesthesia and OR aware.  Preoperative prophylactic Doxycycline 200mg  IV  has been ordered and is on call to the OR.  To OR when ready.     Jaynie CollinsUGONNA  Blu Mcglaun, MD, FACOG Attending Obstetrician & Gynecologist Faculty Practice, [facility]

## 2015-07-09 ENCOUNTER — Encounter (HOSPITAL_COMMUNITY): Payer: Self-pay | Admitting: Obstetrics & Gynecology

## 2015-08-06 ENCOUNTER — Ambulatory Visit: Payer: Self-pay | Admitting: Obstetrics and Gynecology

## 2015-08-06 ENCOUNTER — Encounter: Payer: Self-pay | Admitting: Obstetrics and Gynecology

## 2015-08-06 NOTE — Progress Notes (Signed)
Patient no show'ed to 08/06/2015 1020 post op visit  Cornelia Copa MD Attending Center for [employer] [facility])

## 2015-09-06 ENCOUNTER — Encounter: Payer: Self-pay | Admitting: Obstetrics and Gynecology

## 2015-09-06 ENCOUNTER — Ambulatory Visit (INDEPENDENT_AMBULATORY_CARE_PROVIDER_SITE_OTHER): Payer: Self-pay | Admitting: Obstetrics and Gynecology

## 2015-09-06 VITALS — BP 121/74 | HR 79 | Wt 189.5 lb

## 2015-09-06 DIAGNOSIS — Z3202 Encounter for pregnancy test, result negative: Secondary | ICD-10-CM

## 2015-09-06 DIAGNOSIS — Z30018 Encounter for initial prescription of other contraceptives: Secondary | ICD-10-CM

## 2015-09-06 LAB — POCT PREGNANCY, URINE: Preg Test, Ur: NEGATIVE

## 2015-09-06 MED ORDER — ETONOGESTREL-ETHINYL ESTRADIOL 0.12-0.015 MG/24HR VA RING: VAGINAL_RING | VAGINAL | 12 refills | Status: DC

## 2015-09-06 NOTE — Progress Notes (Signed)
Obstetrics and Gynecology Visit Return Patient Evaluation  Appointment Date: 09/06/2015  Chief Complaint: post D&C follow, desire for contraception  History of Present Illness:  Minerva EndsMarseddez E Corona is a 35 y.o. [id number].  She is s/p a late June d&c or an incomplete AB.  She missed her late July post op visit. Had an LMP earlier this month and no irregular bleeding, pain or issues with intercourse. Last intercourse yesterday.  Took a home UPT approximately 3 weeks ago and was +. No s/s of pregnancy. Pathology negative.   Review of Systems:  her 12 point review of systems is negative or as noted in the History of Present Illness.  Medications: none Allergies: has No Known Allergies.  Physical Exam:  BP 121/74 (BP Location: Right Arm, Patient Position: Sitting, Cuff Size: Normal)   Pulse 79   Wt 189 lb 8 oz (86 kg)   LMP 08/06/2015 (Approximate)   Breastfeeding? No   BMI 32.53 kg/m  Body mass index is 32.53 kg/m. General appearance: Well nourished, well developed female in no acute distress.  Abdomen: diffusely non tender to palpation, non distended  Labs: UPT negative   Assessment: pt doing well s/p suction D&C Plan: No issues and patient doing well from procedure. I told her it's very common to have an SAB and most likely due to aneuploidy which can increase with age but no prediction re: future pregnancies. She would like to go back on nuvaring; no h/o tobacco use, migraines or VTEs. Pt told that if she decides to try again to do PNV x 1 month before trying again . She does note some increased stressors at home due to relationship issues. Doesn't have time to stay and talk to [facility], who is currently seeing another patient anyways.. Pt amenable to having her attempt f/u via phone.   RTC 3 months for repeat pap smear  Cornelia Copaharlie Sherilee Smotherman, Jr MD Attending Center for [facility](Faculty Practice)

## 2015-09-07 ENCOUNTER — Telehealth: Payer: Self-pay | Admitting: Clinical

## 2015-09-07 NOTE — Telephone Encounter (Signed)
Attempt to f/u with patient, someone answered the phone, said "who, who?", then hung up.   Unable to leave message for patient to schedule an appointment today.

## 2015-09-12 ENCOUNTER — Telehealth: Payer: Self-pay | Admitting: Clinical

## 2015-09-12 NOTE — Telephone Encounter (Signed)
Left HIPPA-compliant message to return call to [facility] from [facility] at [facility] at [phone].   Second attempt to schedule an appointment for patient with Integrated Behavioral Health services at Center for [facility].

## 2015-11-05 ENCOUNTER — Encounter: Payer: Self-pay | Admitting: Obstetrics and Gynecology

## 2015-11-05 ENCOUNTER — Encounter: Payer: Self-pay | Admitting: *Deleted

## 2015-11-05 ENCOUNTER — Ambulatory Visit: Payer: Self-pay | Admitting: Obstetrics and Gynecology

## 2015-11-05 NOTE — Progress Notes (Signed)
Patient did not keep GYN pap smear appointment for 11/05/2015. Will have clinic send letter to patient re: missed pap smear.  Cornelia Copaharlie Dolores Ewing, Jr MD Attending Center for [facility](Faculty Practice)

## 2015-11-21 ENCOUNTER — Telehealth (HOSPITAL_COMMUNITY): Payer: Self-pay | Admitting: *Deleted

## 2015-11-21 NOTE — Telephone Encounter (Signed)
Telephoned patient at home number. While explaining to the patient was time for pap smear, patient hung up. Tried to call back and got voicemail. Used interpreter Delorise RoyalsJulie Sowell.

## 2015-11-28 ENCOUNTER — Encounter (HOSPITAL_COMMUNITY): Payer: Self-pay | Admitting: *Deleted

## 2016-08-19 ENCOUNTER — Ambulatory Visit: Payer: Self-pay

## 2016-09-09 ENCOUNTER — Emergency Department (HOSPITAL_COMMUNITY): Payer: Medicaid Other

## 2016-09-09 ENCOUNTER — Encounter (HOSPITAL_COMMUNITY): Payer: Self-pay | Admitting: Emergency Medicine

## 2016-09-09 ENCOUNTER — Emergency Department (HOSPITAL_COMMUNITY)
Admission: EM | Admit: 2016-09-09 | Discharge: 2016-09-09 | Disposition: A | Discharge status: Home / Self Care | Payer: Medicaid Other | Attending: Emergency Medicine | Admitting: Emergency Medicine

## 2016-09-09 DIAGNOSIS — O4690 Antepartum hemorrhage, unspecified, unspecified trimester: Secondary | ICD-10-CM

## 2016-09-09 DIAGNOSIS — Z3A08 8 weeks gestation of pregnancy: Secondary | ICD-10-CM | POA: Insufficient documentation

## 2016-09-09 DIAGNOSIS — F17210 Nicotine dependence, cigarettes, uncomplicated: Secondary | ICD-10-CM | POA: Diagnosis not present

## 2016-09-09 DIAGNOSIS — O209 Hemorrhage in early pregnancy, unspecified: Secondary | ICD-10-CM | POA: Diagnosis not present

## 2016-09-09 LAB — URINALYSIS, ROUTINE W REFLEX MICROSCOPIC
BACTERIA UA: NONE SEEN
Bilirubin Urine: NEGATIVE
Glucose, UA: NEGATIVE mg/dL
HGB URINE DIPSTICK: NEGATIVE
Ketones, ur: NEGATIVE mg/dL
NITRITE: NEGATIVE
Protein, ur: NEGATIVE mg/dL
SPECIFIC GRAVITY, URINE: 1.023 (ref 1.005–1.030)
pH: 5 (ref 5.0–8.0)

## 2016-09-09 LAB — COMPREHENSIVE METABOLIC PANEL
ALBUMIN: 3.4 g/dL — AB (ref 3.5–5.0)
ALT: 14 U/L (ref 14–54)
ANION GAP: 7 (ref 5–15)
AST: 14 U/L — AB (ref 15–41)
Alkaline Phosphatase: 59 U/L (ref 38–126)
BILIRUBIN TOTAL: 0.3 mg/dL (ref 0.3–1.2)
BUN: 7 mg/dL (ref 6–20)
CHLORIDE: 107 mmol/L (ref 101–111)
CO2: 21 mmol/L — AB (ref 22–32)
Calcium: 8.5 mg/dL — ABNORMAL LOW (ref 8.9–10.3)
Creatinine, Ser: 0.58 mg/dL (ref 0.44–1.00)
GFR calc Af Amer: >60 mL/min (ref >60)
GFR calc non Af Amer: >60 mL/min (ref >60)
GLUCOSE: 122 mg/dL — AB (ref 65–99)
POTASSIUM: 3.5 mmol/L (ref 3.5–5.1)
SODIUM: 135 mmol/L (ref 135–145)
TOTAL PROTEIN: 6.8 g/dL (ref 6.5–8.1)

## 2016-09-09 LAB — WET PREP, GENITAL
SPERM: NONE SEEN
Trich, Wet Prep: NONE SEEN
YEAST WET PREP: NONE SEEN

## 2016-09-09 LAB — CBC
HEMATOCRIT: 34.4 % — AB (ref 36.0–46.0)
HEMOGLOBIN: 10.6 g/dL — AB (ref 12.0–15.0)
MCH: 23.5 pg — ABNORMAL LOW (ref 26.0–34.0)
MCHC: 30.8 g/dL (ref 30.0–36.0)
MCV: 76.1 fL — AB (ref 78.0–100.0)
Platelets: 274 10*3/uL (ref 150–400)
RBC: 4.52 MIL/uL (ref 3.87–5.11)
RDW: 18.1 % — AB (ref 11.5–15.5)
WBC: 7.1 10*3/uL (ref 4.0–10.5)

## 2016-09-09 LAB — I-STAT BETA HCG BLOOD, ED (MC, WL, AP ONLY)

## 2016-09-09 LAB — HCG, QUANTITATIVE, PREGNANCY: HCG, BETA CHAIN, QUANT, S: 21912 m[IU]/mL — AB (ref <5)

## 2016-09-09 NOTE — ED Notes (Signed)
On way to US 

## 2016-09-09 NOTE — ED Notes (Addendum)
Patient left prior to receiving discharge instructions. Ambulatory.

## 2016-09-09 NOTE — ED Triage Notes (Addendum)
Pt states she has had several positive pregnancy tests two weeks ago, pt states she started spotting 3 days ago. Denies spotting at this time. Some abd cramping. Pt states she had a miscarriage in June of 2017.

## 2016-09-09 NOTE — ED Provider Notes (Signed)
[facility]PT Provider Note   CSN: [id number] Arrival date & time: 09/09/16  1149     History   Chief Complaint Chief Complaint  Patient presents with  . Possible Pregnancy    HPI [name] is a 36 y.o. female with history of incomplete miscarriage presents to the ED for possible pregnancy, vaginal spotting and increased vaginal discharge. Patient states that 3 weeks ago she took 2 pregnancy tests on the same day and both were positive. That same day she had a small amount of vaginal spotting, and again 3 days ago. Denies spotting at this time. Associated symptoms include mild, intermittent diffuse lower abdominal cramping sometimes most significant right lower quadrant. Describes the cramping as "soreness". LMP July 9-16. Not on any birth control.   HPI  Past Medical History:  Diagnosis Date  . Medical history non-contributory     Patient Active Problem List   Diagnosis Date Noted  . Incomplete spontaneous abortion   . Miscarriage 06/18/2015  . Low grade squamous intraepithelial lesion (LGSIL) on Papanicolaou smear of cervix 12/22/2014    Past Surgical History:  Procedure Laterality Date  . DILATION AND CURETTAGE OF UTERUS N/A 07/06/2015   Procedure: DILATATION AND Evacuation;  Surgeon: Tereso Newcomer, MD;  Location: [facility];  Service: Gynecology;  Laterality: N/A;  . tummy tuck      OB History    Gravida Para Term Preterm AB Living   6 3 3   2 3    SAB TAB Ectopic Multiple Live Births   1 1             Home Medications    Prior to Admission medications   Medication Sig Start Date End Date Taking? Authorizing Provider  docusate sodium (COLACE) 100 MG capsule Take 1 capsule (100 mg total) by mouth 2 (two) times daily as needed. Patient not taking: Reported on 09/06/2015 07/06/15   Tereso Newcomer, MD  etonogestrel-ethinyl estradiol (NUVARING) 0.12-0.015 MG/24HR vaginal ring Insert vaginally and leave in place for 3 consecutive weeks, then remove for 1  week. 09/06/15   [place] Bing, MD  HYDROcodone-acetaminophen (NORCO/VICODIN) 5-325 MG tablet Take 1-2 tablets by mouth every 4 (four) hours as needed. Patient not taking: Reported on 09/06/2015 07/06/15   Anyanwu, Jethro Bastos, MD  ibuprofen (ADVIL,MOTRIN) 800 MG tablet Take 1 tablet (800 mg total) by mouth every 8 (eight) hours as needed. Patient not taking: Reported on 09/06/2015 07/06/15   Tereso Newcomer, MD  norethindrone-ethinyl estradiol (FEMHRT 1/5) 1-5 MG-MCG TABS tablet Take 1 tablet by mouth daily. Patient not taking: Reported on 09/06/2015 07/06/15   Tereso Newcomer, MD    Family History Family History  Problem Relation Age of Onset  . Breast cancer Mother   . Diabetes Maternal Grandmother     Social History Social History  Substance Use Topics  . Smoking status: Current Some Day Smoker    Types: Cigarettes    Last attempt to quit: 07/05/2014  . Smokeless tobacco: Never Used  . Alcohol use No     Comment: occasionally     Allergies   Patient has no known allergies.   Review of Systems Review of Systems  Constitutional: Negative for fever.  Gastrointestinal: Positive for abdominal pain. Negative for nausea and vomiting.  Genitourinary: Positive for menstrual problem, vaginal bleeding and vaginal discharge. Negative for dysuria, hematuria and vaginal pain.     Physical Exam Updated Vital Signs BP 112/65 (BP Location: Right Arm)   Pulse 83  Temp 98.3 F (36.8 C)   Resp 20   LMP 07/18/2016   SpO2 100%   Physical Exam  Constitutional: She is oriented to person, place, and time. She appears well-developed and well-nourished. No distress.  HENT:  Head: Normocephalic and atraumatic.  Nose: Nose normal.  Mouth/Throat: Oropharynx is clear and moist. No oropharyngeal exudate.  Eyes: Pupils are equal, round, and reactive to light. Conjunctivae and EOM are normal.  Neck: Normal range of motion. Neck supple. No JVD present.  Cardiovascular: Normal rate, regular  rhythm, normal heart sounds and intact distal pulses.   No murmur heard. Pulmonary/Chest: Effort normal and breath sounds normal. No respiratory distress. She has no wheezes. She has no rales. She exhibits no tenderness.  Abdominal: Soft. Bowel sounds are normal. She exhibits no distension and no mass. There is no tenderness. There is no rebound and no guarding.  No suprapubic or CVA tenderness   Genitourinary: Vaginal discharge found.  Genitourinary Comments: External genitalia normal without erythema, edema, tenderness, discharge or lesions.  No groin lymphadenopathy.  Vaginal mucosa normal, pink without lesions +Small amount of white thin discharge in upper vaginal vault and under cervix Uterus in midline, smooth, not enlarged or tender. No CMT. Non palpable adnexa.  Musculoskeletal: Normal range of motion. She exhibits no deformity.  Lymphadenopathy:    She has no cervical adenopathy.  Neurological: She is alert and oriented to person, place, and time. No sensory deficit.  Skin: Skin is warm and dry. Capillary refill takes less than 2 seconds.  Psychiatric: She has a normal mood and affect. Her behavior is normal. Judgment and thought content normal.  Nursing note and vitals reviewed.    ED Treatments / Results  Labs (all labs ordered are listed, but only abnormal results are displayed) Labs Reviewed  WET PREP, GENITAL - Abnormal; Notable for the following:       Result Value   Clue Cells Wet Prep HPF POC PRESENT (*)    WBC, Wet Prep HPF POC FEW (*)    All other components within normal limits  COMPREHENSIVE METABOLIC PANEL - Abnormal; Notable for the following:    CO2 21 (*)    Glucose, Bld 122 (*)    Calcium 8.5 (*)    Albumin 3.4 (*)    AST 14 (*)    All other components within normal limits  CBC - Abnormal; Notable for the following:    Hemoglobin 10.6 (*)    HCT 34.4 (*)    MCV 76.1 (*)    MCH 23.5 (*)    RDW 18.1 (*)    All other components within normal limits    URINALYSIS, ROUTINE W REFLEX MICROSCOPIC - Abnormal; Notable for the following:    Leukocytes, UA TRACE (*)    Squamous Epithelial / LPF 0-5 (*)    All other components within normal limits  HCG, QUANTITATIVE, PREGNANCY - Abnormal; Notable for the following:    hCG, Beta Chain, Quant, S 21,912 (*)    All other components within normal limits  I-STAT BETA HCG BLOOD, ED ([facility], [facility], [facility] ONLY) - Abnormal; Notable for the following:    I-stat hCG, quantitative >2,000.0 (*)    All other components within normal limits  GC/CHLAMYDIA PROBE AMP ([place]) NOT AT [facility]    EKG  EKG Interpretation None       Radiology [place] Ob Comp Less 14 Wks  Result Date: 09/09/2016 CLINICAL DATA:  Vaginal bleeding with pelvic pain EXAM: OBSTETRIC <14 WK [place]  AND TRANSVAGINAL OB US TECHNIQUE: Both transabdominal and transvaginal ultrasound examinations were performed for complete evaluation of the gestation as well as the maternal uterus, adnexal regions, and pelvic cul-de-sac. Transvaginal technique was performed to assess early pregnancy. COMPARISON:  None. FINDINGS: Intrauterine gestational sac: Visualized Yolk sac:  Probable yolk sac visualized. Embryo:  Visualized Cardiac Activity: Visualized Heart Rate: 160  bpm CRL:  14.3  mm   7 w   5 d                  [place] EDC: 04/23/2017 Subchorionic hemorrhage:  Small subchorionic hemorrhage Maternal uterus/adnexae: Left ovary measures 1.9 by 3.6 x 2.5 cm. Echogenic cystic area in the left ovary measuring 2.2 x 1.6 cm. Right ovary measures 3.2 x 1.9 by 2.2 cm IMPRESSION: 1. Single viable intrauterine pregnancy as above. 2. Small subchorionic hemorrhage 3. Echogenic cystic area left ovary, possible hemorrhagic luteal cyst or follicles with adjacent hemorrhagic luteal cyst. Electronically Signed   By: Jasmine Pang M.D.   On: 09/09/2016 15:37   US Ob Transvaginal  Result Date: 09/09/2016 CLINICAL DATA:  Vaginal bleeding with pelvic pain EXAM: OBSTETRIC <14 WK [place] AND TRANSVAGINAL OB  US TECHNIQUE: Both transabdominal and transvaginal ultrasound examinations were performed for complete evaluation of the gestation as well as the maternal uterus, adnexal regions, and pelvic cul-de-sac. Transvaginal technique was performed to assess early pregnancy. COMPARISON:  None. FINDINGS: Intrauterine gestational sac: Visualized Yolk sac:  Probable yolk sac visualized. Embryo:  Visualized Cardiac Activity: Visualized Heart Rate: 160  bpm CRL:  14.3  mm   7 w   5 d                  [place] EDC: 04/23/2017 Subchorionic hemorrhage:  Small subchorionic hemorrhage Maternal uterus/adnexae: Left ovary measures 1.9 by 3.6 x 2.5 cm. Echogenic cystic area in the left ovary measuring 2.2 x 1.6 cm. Right ovary measures 3.2 x 1.9 by 2.2 cm IMPRESSION: 1. Single viable intrauterine pregnancy as above. 2. Small subchorionic hemorrhage 3. Echogenic cystic area left ovary, possible hemorrhagic luteal cyst or follicles with adjacent hemorrhagic luteal cyst. Electronically Signed   By: Jasmine Pang M.D.   On: 09/09/2016 15:37    Procedures Procedures (including critical care time)  Medications Ordered in ED Medications - No data to display   Initial Impression / Assessment and Plan / ED Course  I have reviewed the triage vital signs and the nursing notes.  Pertinent labs & imaging results that were available during my care of the patient were reviewed by me and considered in my medical decision making (see chart for details).  Clinical Course as of Sep 09 1729  Tue Sep 09, 2016  1425 Hemoglobin: (!) 10.6 [CG]    Clinical Course User Index [CG] Liberty Handy, PA-C    37 yo female with h/o incomplete abortion last year presents to ED for evaluation of possible pregnancy. She took 2 pregnancy tests three weeks ago and both were positive. She reports 2 episodes of small vaginal bleeding without clots or tissue since then, mild lower abdominal cramping and increased vaginal discharge. Does not have OBGYN. No  fevers, chills, dysuria. No new sexual partners.   On exam VS are wnl. Abdomen benign. Small amount of thin white discharge in vaginal vault, otherwise pelvic normal. Triage lab work reviewed, mostly reassuring. Anemia at 10.6. No UTI on u/a. 22K hcg quant. Wet prep shows clue cells. Ultrasound shows viable pregnancy approx 8 w, heart  activity noted.  Pt walked out after I had discussed lab work and ultrasound findings, pending wet prep results.  Final Clinical Impressions(s) / ED Diagnoses   Final diagnoses:  Vaginal bleeding during pregnancy    New Prescriptions Discharge Medication List as of 09/09/2016  4:23 PM       Liberty Handy, PA-C 09/09/16 1732    Doug Sou, MD 09/09/16 1754

## 2016-09-11 LAB — GC/CHLAMYDIA PROBE AMP (~~LOC~~) NOT AT ARMC
CHLAMYDIA, DNA PROBE: NEGATIVE
NEISSERIA GONORRHEA: NEGATIVE

## 2016-09-22 ENCOUNTER — Inpatient Hospital Stay (HOSPITAL_COMMUNITY)
Admission: AD | Admit: 2016-09-22 | Discharge: 2016-09-22 | Disposition: A | Discharge status: Home / Self Care | Payer: Medicaid Other | Source: Ambulatory Visit | Attending: Family Medicine | Admitting: Family Medicine

## 2016-09-22 ENCOUNTER — Encounter (HOSPITAL_COMMUNITY): Payer: Self-pay

## 2016-09-22 DIAGNOSIS — R4582 Worries: Secondary | ICD-10-CM

## 2016-09-22 DIAGNOSIS — R102 Pelvic and perineal pain: Secondary | ICD-10-CM | POA: Diagnosis present

## 2016-09-22 DIAGNOSIS — O26891 Other specified pregnancy related conditions, first trimester: Secondary | ICD-10-CM

## 2016-09-22 DIAGNOSIS — R4589 Other symptoms and signs involving emotional state: Secondary | ICD-10-CM

## 2016-09-22 DIAGNOSIS — O99331 Smoking (tobacco) complicating pregnancy, first trimester: Secondary | ICD-10-CM | POA: Diagnosis not present

## 2016-09-22 DIAGNOSIS — R109 Unspecified abdominal pain: Secondary | ICD-10-CM

## 2016-09-22 DIAGNOSIS — Z3A09 9 weeks gestation of pregnancy: Secondary | ICD-10-CM | POA: Insufficient documentation

## 2016-09-22 LAB — URINALYSIS, ROUTINE W REFLEX MICROSCOPIC
BILIRUBIN URINE: NEGATIVE
GLUCOSE, UA: NEGATIVE mg/dL
HGB URINE DIPSTICK: NEGATIVE
KETONES UR: NEGATIVE mg/dL
Nitrite: NEGATIVE
Protein, ur: NEGATIVE mg/dL
Specific Gravity, Urine: 1.016 (ref 1.005–1.030)
pH: 5 (ref 5.0–8.0)

## 2016-09-22 NOTE — Discharge Instructions (Signed)

## 2016-09-22 NOTE — MAU Note (Signed)
Pt c/o lower abdominal pain and pressure for the last couple of weeks. Pt states the pain got worse yesterday. Pt c/o shortness of breath for the last two days. Pt states she has had bleeding twice with this pregnancy. Pt states the last time was last Saturday. Pt denies bleeding today.

## 2016-09-22 NOTE — MAU Provider Note (Signed)
History     CSN: [id number]  Arrival date and time: 09/22/16 1456   First Provider Initiated Contact with Patient 09/22/16 1547      Chief Complaint  Patient presents with  . Shortness of Breath  . Abdominal Pain   HPI   Ms.[name] is a 36 y.o. female [phone] @ [redacted]w[redacted]d with pelvic pain. The pain started 2 weeks ago. The pressure come and goes.  Bleeding 2x this pregnancy, none currently. The pain is moderate at this time 5/10. Feeling worried.  + viability [place] during last visit to ER.   OB History    Gravida Para Term Preterm AB Living   SAB TAB Ectopic Multiple Live Births   1 1            Past Medical History:  Diagnosis Date  . Medical history non-contributory     Past Surgical History:  Procedure Laterality Date  . DILATION AND CURETTAGE OF UTERUS N/A 07/06/2015   Procedure: DILATATION AND Evacuation;  Surgeon: Tereso Newcomer, MD;  Location: [facility];  Service: Gynecology;  Laterality: N/A;  . tummy tuck      Family History  Problem Relation Age of Onset  . Breast cancer Mother   . Diabetes Maternal Grandmother     Social History  Substance Use Topics  . Smoking status: Current Some Day Smoker    Types: Cigarettes    Last attempt to quit: 07/05/2014  . Smokeless tobacco: Never Used  . Alcohol use No     Comment: occasionally    Allergies: No Known Allergies  Prescriptions Prior to Admission  Medication Sig Dispense Refill Last Dose  . docusate sodium (COLACE) 100 MG capsule Take 1 capsule (100 mg total) by mouth 2 (two) times daily as needed. (Patient not taking: Reported on 09/06/2015) 30 capsule 2 Not Taking  . etonogestrel-ethinyl estradiol (NUVARING) 0.12-0.015 MG/24HR vaginal ring Insert vaginally and leave in place for 3 consecutive weeks, then remove for 1 week. 1 each 12   . HYDROcodone-acetaminophen (NORCO/VICODIN) 5-325 MG tablet Take 1-2 tablets by mouth every 4 (four) hours as needed. (Patient not taking: Reported on  09/06/2015) 20 tablet 0 Not Taking  . ibuprofen (ADVIL,MOTRIN) 800 MG tablet Take 1 tablet (800 mg total) by mouth every 8 (eight) hours as needed. (Patient not taking: Reported on 09/06/2015) 30 tablet 2 Not Taking  . norethindrone-ethinyl estradiol (FEMHRT 1/5) 1-5 MG-MCG TABS tablet Take 1 tablet by mouth daily. (Patient not taking: Reported on 09/06/2015) 28 tablet 11 Not Taking   Results for orders placed or performed during the hospital encounter of 09/22/16 (from the past 48 hour(s))  Urinalysis, Routine w reflex microscopic     Status: Abnormal   Collection Time: 09/22/16  3:20 PM  Result Value Ref Range   Color, Urine YELLOW YELLOW   APPearance CLEAR CLEAR   Specific Gravity, Urine 1.016 1.005 - 1.030   pH 5.0 5.0 - 8.0   Glucose, UA NEGATIVE NEGATIVE mg/dL   Hgb urine dipstick NEGATIVE NEGATIVE   Bilirubin Urine NEGATIVE NEGATIVE   Ketones, ur NEGATIVE NEGATIVE mg/dL   Protein, ur NEGATIVE NEGATIVE mg/dL   Nitrite NEGATIVE NEGATIVE   Leukocytes, UA TRACE (A) NEGATIVE   RBC / HPF 0-5 0 - 5 RBC/hpf   WBC, UA 0-5 0 - 5 WBC/hpf   Bacteria, UA RARE (A) NONE SEEN   Squamous Epithelial / LPF 0-5 (A) NONE SEEN   Mucus  PRESENT    Review of Systems  Gastrointestinal: Positive for abdominal pain.  Genitourinary: Positive for pelvic pain. Negative for dysuria, vaginal bleeding and vaginal discharge.   Physical Exam   Blood pressure 122/71, pulse 84, temperature 98.2 F (36.8 C), temperature source Oral, resp. rate 18, height  (1.626 m), last menstrual period 07/18/2016, SpO2 100 %.  Physical Exam  Constitutional: She is oriented to person, place, and time. She appears well-developed and well-nourished. No distress.  HENT:  Head: Normocephalic.  GI: Soft. She exhibits no distension. There is no tenderness. There is no rebound.  Genitourinary:  Genitourinary Comments: Dilation: Closed Effacement (%): Thick Cervical Position: Posterior Exam by:: Victorino Dike Naziyah Tieszen,NP   Musculoskeletal: Normal range of motion.  Neurological: She is alert and oriented to person, place, and time.  Skin: Skin is warm. She is not diaphoretic.  Psychiatric: Her behavior is normal.   MAU Course  Procedures  None  MDM  Active fetus on bedside US + fetal heart tones observed   Assessment and Plan   A:  1. Abdominal pain in pregnancy, first trimester   2. Feeling worried     P:  Discharge home in stable condition Patient feels reassured Return to MAU if symptoms worsen Start prenatal care  Saed Hudlow, Harolyn Rutherford, NP 09/22/2016 7:24 PM

## 2016-10-16 ENCOUNTER — Encounter: Payer: Self-pay | Admitting: *Deleted

## 2016-10-16 ENCOUNTER — Encounter: Payer: Self-pay | Admitting: Obstetrics

## 2016-10-16 ENCOUNTER — Ambulatory Visit (INDEPENDENT_AMBULATORY_CARE_PROVIDER_SITE_OTHER): Payer: Medicaid Other | Admitting: Obstetrics

## 2016-10-16 ENCOUNTER — Other Ambulatory Visit (HOSPITAL_COMMUNITY)
Admission: RE | Admit: 2016-10-16 | Discharge: 2016-10-16 | Disposition: A | Discharge status: Home / Self Care | Payer: Medicaid Other | Source: Ambulatory Visit | Attending: Obstetrics | Admitting: Obstetrics

## 2016-10-16 VITALS — BP 107/84 | HR 84 | Temp 97.0°F | Wt 195.0 lb

## 2016-10-16 DIAGNOSIS — Z3481 Encounter for supervision of other normal pregnancy, first trimester: Secondary | ICD-10-CM

## 2016-10-16 DIAGNOSIS — Z283 Underimmunization status: Secondary | ICD-10-CM

## 2016-10-16 DIAGNOSIS — Z2839 Other underimmunization status: Secondary | ICD-10-CM

## 2016-10-16 DIAGNOSIS — O09899 Supervision of other high risk pregnancies, unspecified trimester: Secondary | ICD-10-CM

## 2016-10-16 DIAGNOSIS — O0990 Supervision of high risk pregnancy, unspecified, unspecified trimester: Secondary | ICD-10-CM | POA: Insufficient documentation

## 2016-10-16 DIAGNOSIS — Z3491 Encounter for supervision of normal pregnancy, unspecified, first trimester: Secondary | ICD-10-CM | POA: Insufficient documentation

## 2016-10-16 DIAGNOSIS — Z3490 Encounter for supervision of normal pregnancy, unspecified, unspecified trimester: Secondary | ICD-10-CM

## 2016-10-16 DIAGNOSIS — R87612 Low grade squamous intraepithelial lesion on cytologic smear of cervix (LGSIL): Secondary | ICD-10-CM

## 2016-10-16 DIAGNOSIS — O9989 Other specified diseases and conditions complicating pregnancy, childbirth and the puerperium: Secondary | ICD-10-CM

## 2016-10-16 DIAGNOSIS — F17210 Nicotine dependence, cigarettes, uncomplicated: Secondary | ICD-10-CM

## 2016-10-16 MED ORDER — PRENATE MINI 29-0.6-0.4-350 MG PO CAPS: 1.0000 | ORAL_CAPSULE | Freq: Every day | ORAL | 4 refills | Status: AC

## 2016-10-16 NOTE — Progress Notes (Signed)
Presents for NOB visit. 

## 2016-10-16 NOTE — Progress Notes (Signed)
Subjective:    [name] is being seen today for her first obstetrical visit.  This is a planned pregnancy. She is at [redacted]w[redacted]d gestation. Her obstetrical history is significant for obesity and smoker. Relationship with FOB: spouse, living together. Patient does intend to breast feed. Pregnancy history fully reviewed.  The information documented in the HPI was reviewed and verified.  Menstrual History: OB History    Gravida Para Term Preterm AB Living   SAB TAB Ectopic Multiple Live Births   1 1 0 0 3       Patient's last menstrual period was 07/11/2016.    Past Medical History:  Diagnosis Date  . Medical history non-contributory     Past Surgical History:  Procedure Laterality Date  . DILATION AND CURETTAGE OF UTERUS N/A 07/06/2015   Procedure: DILATATION AND Evacuation;  Surgeon: Tereso Newcomer, MD;  Location: [facility];  Service: Gynecology;  Laterality: N/A;  . tummy tuck       (Not in a hospital admission) No Known Allergies  Social History  Substance Use Topics  . Smoking status: Current Some Day Smoker    Types: Cigarettes    Last attempt to quit: 07/05/2014  . Smokeless tobacco: Never Used  . Alcohol use No     Comment: occasionally    Family History  Problem Relation Age of Onset  . Breast cancer Mother   . Diabetes Maternal Grandmother      Review of Systems Constitutional: negative for weight loss Gastrointestinal: negative for vomiting Genitourinary:negative for genital lesions and vaginal discharge and dysuria Musculoskeletal:negative for back pain Behavioral/Psych: negative for abusive relationship, depression, illegal drug usage and tobacco use    Objective:    BP 107/84   Pulse 84   Temp (!) 97 F (36.1 C)   Wt 195 lb (88.5 kg)   LMP 07/11/2016   BMI 33.47 kg/m  General Appearance:    Alert, cooperative, no distress, appears stated age  Head:    Normocephalic, without obvious abnormality, atraumatic  Eyes:    PERRL,  conjunctiva/corneas clear, EOM's intact, fundi    benign, both eyes  Ears:    Normal TM's and external ear canals, both ears  Nose:   Nares normal, septum midline, mucosa normal, no drainage    or sinus tenderness  Throat:   Lips, mucosa, and tongue normal; teeth and gums normal  Neck:   Supple, symmetrical, trachea midline, no adenopathy;    thyroid:  no enlargement/tenderness/nodules; no carotid   bruit or JVD  Back:     Symmetric, no curvature, ROM normal, no CVA tenderness  Lungs:     Clear to auscultation bilaterally, respirations unlabored  Chest Wall:    No tenderness or deformity   Heart:    Regular rate and rhythm, S1 and S2 normal, no murmur, rub   or gallop  Breast Exam:    No tenderness, masses, or nipple abnormality  Abdomen:     Soft, non-tender, bowel sounds active all four quadrants,    no masses, no organomegaly  Genitalia:    Normal female without lesion, discharge or tenderness  Extremities:   Extremities normal, atraumatic, no cyanosis or edema  Pulses:   2+ and symmetric all extremities  Skin:   Skin color, texture, turgor normal, no rashes or lesions  Lymph nodes:   Cervical, supraclavicular, and axillary nodes normal  Neurologic:   CNII-XII intact, normal strength, sensation and reflexes  throughout      Lab Review Urine pregnancy test Labs reviewed yes Radiologic studies reviewed no  Assessment:    Pregnancy at [redacted]w[redacted]d weeks    Plan:     1. Encounter for supervision of normal pregnancy, antepartum, unspecified gravidity Rx: - Enroll Patient in Babyscripts - Obstetric Panel, Including HIV - Hemoglobinopathy evaluation - Cystic Fibrosis Mutation 97 - Culture, OB Urine - Varicella zoster antibody, IgG - Cervicovaginal ancillary only - Prenat w/o A-FeCbn-Meth-FA-DHA (PRENATE MINI) 29-0.6-0.4-350 MG CAPS; Take 1 capsule by mouth daily before breakfast.  Dispense: 90 capsule; Refill: 4 - Cytology - PAP  Prenatal vitamins.  Counseling provided  regarding continued use of seat belts, cessation of alcohol consumption, smoking or use of illicit drugs; infection precautions i.e., influenza/TDAP immunizations, toxoplasmosis,CMV, parvovirus, listeria and varicella; workplace safety, exercise during pregnancy; routine dental care, safe medications, sexual activity, hot tubs, saunas, pools, travel, caffeine use, fish and methlymercury, potential toxins, hair treatments, varicose veins Weight gain recommendations per IOM guidelines reviewed: underweight/BMI< 18.5--> gain 28 - 40 lbs; normal weight/BMI 18.5 - 24.9--> gain 25 - 35 lbs; overweight/BMI 25 - 29.9--> gain 15 - 25 lbs; obese/BMI >30->gain  11 - 20 lbs Problem list reviewed and updated. FIRST/CF mutation testing/NIPT/QUAD SCREEN/fragile X/Ashkenazi Jewish population testing/Spinal muscular atrophy discussed: requested. Role of ultrasound in pregnancy discussed; fetal survey: requested. Amniocentesis discussed: not indicated. VBAC calculator score: VBAC consent form provided No orders of the defined types were placed in this encounter.  Orders Placed This Encounter  Procedures  . Culture, OB Urine  . Obstetric Panel, Including HIV  . Hemoglobinopathy evaluation  . Cystic Fibrosis Mutation 97  . Varicella zoster antibody, IgG    Follow up in 2 weeks. 50% of 20 min visit spent on counseling and coordination of care.

## 2016-10-17 LAB — CYTOLOGY - PAP
Diagnosis: UNDETERMINED — AB
HPV: NOT DETECTED

## 2016-10-17 LAB — CERVICOVAGINAL ANCILLARY ONLY
BACTERIAL VAGINITIS: POSITIVE — AB
CANDIDA VAGINITIS: NEGATIVE
CHLAMYDIA, DNA PROBE: NEGATIVE
NEISSERIA GONORRHEA: NEGATIVE
TRICH (WINDOWPATH): NEGATIVE

## 2016-10-18 LAB — CULTURE, OB URINE

## 2016-10-18 LAB — URINE CULTURE, OB REFLEX

## 2016-10-20 ENCOUNTER — Telehealth: Payer: Self-pay

## 2016-10-20 ENCOUNTER — Other Ambulatory Visit: Payer: Self-pay | Admitting: Obstetrics

## 2016-10-20 ENCOUNTER — Other Ambulatory Visit: Payer: Self-pay

## 2016-10-20 DIAGNOSIS — B9689 Other specified bacterial agents as the cause of diseases classified elsewhere: Secondary | ICD-10-CM

## 2016-10-20 DIAGNOSIS — N760 Acute vaginitis: Principal | ICD-10-CM

## 2016-10-20 MED ORDER — SECNIDAZOLE 2 G PO PACK: 1.0000 | PACK | Freq: Once | ORAL | 2 refills | Status: AC

## 2016-10-20 MED ORDER — SECNIDAZOLE 2 G PO PACK: 1.0000 | PACK | Freq: Once | ORAL | 2 refills | Status: DC

## 2016-10-20 NOTE — Telephone Encounter (Signed)
-----   Message from Brock Bad, MD sent at 10/20/2016  5:46 AM EDT ----- Solosec Rx for BV.

## 2016-10-20 NOTE — Telephone Encounter (Signed)
Patient was notified of results and Rx. 

## 2016-10-21 ENCOUNTER — Other Ambulatory Visit: Payer: Self-pay

## 2016-10-21 DIAGNOSIS — Z3490 Encounter for supervision of normal pregnancy, unspecified, unspecified trimester: Secondary | ICD-10-CM

## 2016-10-21 MED ORDER — SECNIDAZOLE 2 G PO PACK: 2.0000 g | PACK | Freq: Once | ORAL | 0 refills | Status: AC

## 2016-10-24 LAB — CYSTIC FIBROSIS MUTATION 97: Interpretation: NOT DETECTED

## 2016-10-24 LAB — OBSTETRIC PANEL, INCLUDING HIV
ANTIBODY SCREEN: NEGATIVE
BASOS: 0 %
Basophils Absolute: 0 10*3/uL (ref 0.0–0.2)
EOS (ABSOLUTE): 0.1 10*3/uL (ref 0.0–0.4)
EOS: 1 %
HEMATOCRIT: 33.7 % — AB (ref 34.0–46.6)
HEMOGLOBIN: 10.3 g/dL — AB (ref 11.1–15.9)
HEP B S AG: NEGATIVE
HIV Screen 4th Generation wRfx: NONREACTIVE
Immature Grans (Abs): 0 10*3/uL (ref 0.0–0.1)
Immature Granulocytes: 0 %
LYMPHS ABS: 1.8 10*3/uL (ref 0.7–3.1)
Lymphs: 24 %
MCH: 23.7 pg — AB (ref 26.6–33.0)
MCHC: 30.6 g/dL — AB (ref 31.5–35.7)
MCV: 78 fL — AB (ref 79–97)
MONOS ABS: 0.6 10*3/uL (ref 0.1–0.9)
Monocytes: 9 %
NEUTROS ABS: 4.9 10*3/uL (ref 1.4–7.0)
Neutrophils: 66 %
Platelets: 286 10*3/uL (ref 150–379)
RBC: 4.34 x10E6/uL (ref 3.77–5.28)
RDW: 18.3 % — ABNORMAL HIGH (ref 12.3–15.4)
RH TYPE: POSITIVE
RPR: NONREACTIVE
Rubella Antibodies, IGG: <0.9 index — ABNORMAL LOW (ref >0.99)
WBC: 7.5 10*3/uL (ref 3.4–10.8)

## 2016-10-24 LAB — HEMOGLOBINOPATHY EVALUATION
HGB C: 0 %
HGB S: 0 %
HGB VARIANT: 0 %
Hemoglobin A2 Quantitation: 2 % (ref 1.8–3.2)
Hemoglobin F Quantitation: 0 % (ref 0.0–2.0)
Hgb A: 98 % (ref 96.4–98.8)

## 2016-10-24 LAB — VARICELLA ZOSTER ANTIBODY, IGG: VARICELLA: 437 {index} (ref >165)

## 2016-10-26 DIAGNOSIS — O9989 Other specified diseases and conditions complicating pregnancy, childbirth and the puerperium: Secondary | ICD-10-CM

## 2016-10-26 DIAGNOSIS — Z283 Underimmunization status: Secondary | ICD-10-CM | POA: Insufficient documentation

## 2016-10-26 DIAGNOSIS — O09899 Supervision of other high risk pregnancies, unspecified trimester: Secondary | ICD-10-CM | POA: Insufficient documentation

## 2016-10-30 ENCOUNTER — Encounter: Payer: Self-pay | Admitting: Obstetrics

## 2016-10-30 ENCOUNTER — Encounter: Payer: Self-pay | Admitting: *Deleted

## 2016-10-30 ENCOUNTER — Ambulatory Visit (INDEPENDENT_AMBULATORY_CARE_PROVIDER_SITE_OTHER): Payer: Medicaid Other | Admitting: Obstetrics

## 2016-10-30 VITALS — BP 111/73 | HR 89 | Wt 194.6 lb

## 2016-10-30 DIAGNOSIS — Z3480 Encounter for supervision of other normal pregnancy, unspecified trimester: Secondary | ICD-10-CM

## 2016-10-30 DIAGNOSIS — Z3482 Encounter for supervision of other normal pregnancy, second trimester: Secondary | ICD-10-CM

## 2016-10-30 DIAGNOSIS — K219 Gastro-esophageal reflux disease without esophagitis: Secondary | ICD-10-CM

## 2016-10-30 DIAGNOSIS — O26892 Other specified pregnancy related conditions, second trimester: Secondary | ICD-10-CM

## 2016-10-30 DIAGNOSIS — R300 Dysuria: Secondary | ICD-10-CM

## 2016-10-30 DIAGNOSIS — F5101 Primary insomnia: Secondary | ICD-10-CM

## 2016-10-30 MED ORDER — ZOLPIDEM TARTRATE 5 MG PO TABS: 5.0000 mg | ORAL_TABLET | Freq: Every evening | ORAL | 0 refills | Status: DC | PRN

## 2016-10-30 MED ORDER — OMEPRAZOLE 20 MG PO CPDR: 20.0000 mg | DELAYED_RELEASE_CAPSULE | Freq: Two times a day (BID) | ORAL | 5 refills | Status: DC

## 2016-10-30 NOTE — Progress Notes (Signed)
Subjective:  [name] is a 36 y.o. [phone] at 597w6d being seen today for ongoing prenatal care.  She is currently monitored for the following issues for this low-risk pregnancy and has Low grade squamous intraepithelial lesion (LGSIL) on Papanicolaou smear of cervix; Supervision of normal pregnancy, antepartum; and Rubella non-immune status, antepartum on her problem list.  Patient reports no complaints.  Contractions: Not present. Vag. Bleeding: None.  Movement: Present. Denies leaking of fluid.   The following portions of the patient's history were reviewed and updated as appropriate: allergies, current medications, past family history, past medical history, past social history, past surgical history and problem list. Problem list updated.  Objective:   Vitals:   10/30/16 1058  BP: 111/73  Pulse: 89  Weight: 194 lb 9.6 oz (88.3 kg)    Fetal Status: Fetal Heart Rate (bpm): 140   Movement: Present     General:  Alert, oriented and cooperative. Patient is in no acute distress.  Skin: Skin is warm and dry. No rash noted.   Cardiovascular: Normal heart rate noted  Respiratory: Normal respiratory effort, no problems with respiration noted  Abdomen: Soft, gravid, appropriate for gestational age. Pain/Pressure: Absent     Pelvic:  Cervical exam deferred        Extremities: Normal range of motion.  Edema: None  Mental Status: Normal mood and affect. Normal behavior. Normal judgment and thought content.   Urinalysis:      Assessment and Plan:  Pregnancy: [id number] at 667w6d  1. Supervision of other normal pregnancy, antepartum Rx: - AFP TETRA - US MFM OB COMP + 14 WK; Future  2. Dysuria during pregnancy in second trimester Rx: - Culture, OB Urine  3. Primary insomnia Rx: - zolpidem (AMBIEN) 5 MG tablet; Take 1 tablet (5 mg total) by mouth at bedtime as needed for sleep.  Dispense: 30 tablet; Refill: 0  4. GERD without esophagitis Rx: - omeprazole (PRILOSEC) 20 MG capsule;  Take 1 capsule (20 mg total) by mouth 2 (two) times daily before a meal.  Dispense: 60 capsule; Refill: 5  Preterm labor symptoms and general obstetric precautions including but not limited to vaginal bleeding, contractions, leaking of fluid and fetal movement were reviewed in detail with the patient. Please refer to After Visit Summary for other counseling recommendations.  Return in about 4 weeks (around 11/27/2016) for ROB.   Brock BadHarper, Charles A, MD

## 2016-10-30 NOTE — Progress Notes (Signed)
Pt c/o dysuria. 

## 2016-11-01 LAB — URINE CULTURE, OB REFLEX

## 2016-11-01 LAB — CULTURE, OB URINE

## 2016-11-02 LAB — AFP TETRA
DIA MOM VALUE: 0.91
DIA Value (EIA): 133.42 pg/mL
DSR (By Age)    1 IN: 325
DSR (Second Trimester) 1 IN: 1330
GESTATIONAL AGE AFP: 16 wk
MSAFP Mom: 0.89
MSAFP: 24.9 ng/mL
MSHCG Mom: 0.56
MSHCG: 18666 m[IU]/mL
Maternal Age At EDD: 34.6 yr
OSB RISK: 10000
T18 (By Age): 1:1265 {titer}
Test Results:: NEGATIVE
UE3 MOM: 0.49
UE3 VALUE: 0.35 ng/mL
Weight: 194 [lb_av]

## 2016-11-03 ENCOUNTER — Telehealth: Payer: Self-pay | Admitting: Pediatrics

## 2016-11-03 ENCOUNTER — Other Ambulatory Visit: Payer: Self-pay | Admitting: Obstetrics

## 2016-11-03 DIAGNOSIS — B373 Candidiasis of vulva and vagina: Secondary | ICD-10-CM

## 2016-11-03 DIAGNOSIS — B3731 Acute candidiasis of vulva and vagina: Secondary | ICD-10-CM

## 2016-11-03 MED ORDER — TERCONAZOLE 0.8 % VA CREA: 1.0000 | TOPICAL_CREAM | Freq: Every day | VAGINAL | 0 refills | Status: DC

## 2016-11-03 NOTE — Telephone Encounter (Signed)
I called pt to report rx sent to pharmacy.  [facility] I resent rx to appropriate pharmacy. She voiced understanding and agreed with plan.

## 2016-11-03 NOTE — Telephone Encounter (Signed)
Terazol 3 Rx

## 2016-11-03 NOTE — Telephone Encounter (Signed)
Pt called for results of Urine Cx done last week.  The results are in computer, revealed yeast.  Pt states she does have yeast infection and is requesting rx for that.  Rx for Terazol ok? 3 or 7 days?

## 2016-11-13 ENCOUNTER — Encounter (HOSPITAL_COMMUNITY): Payer: Self-pay | Admitting: Obstetrics

## 2016-11-13 ENCOUNTER — Telehealth: Payer: Self-pay | Admitting: *Deleted

## 2016-11-13 NOTE — Telephone Encounter (Signed)
Pt called to office with problems after yeast tx.  Return call to pt. Pt states she used Terazol 3 cream as prescribed but she is still feeling symtoms. Pt states she did have sex during use and isn't sure if that had effect on use.  Pt would like to know if she needs further tx.   Please advise, if [facility] [address]

## 2016-11-14 ENCOUNTER — Other Ambulatory Visit: Payer: Self-pay | Admitting: Obstetrics

## 2016-11-14 DIAGNOSIS — B373 Candidiasis of vulva and vagina: Secondary | ICD-10-CM

## 2016-11-14 DIAGNOSIS — B3731 Acute candidiasis of vulva and vagina: Secondary | ICD-10-CM

## 2016-11-14 NOTE — Telephone Encounter (Signed)
Terazol 3 should be adequate.  Nothing further needed.

## 2016-11-18 ENCOUNTER — Telehealth: Payer: Self-pay

## 2016-11-18 MED ORDER — FLUCONAZOLE 150 MG PO TABS: 150.0000 mg | ORAL_TABLET | Freq: Once | ORAL | 0 refills | Status: AC

## 2016-11-18 NOTE — Telephone Encounter (Signed)
Returned call and patient stated that shr is still experiencing vaginal itching and burning despite using terconazole. Advised would send rx with provider approval.

## 2016-11-21 ENCOUNTER — Ambulatory Visit (HOSPITAL_COMMUNITY)
Admission: RE | Admit: 2016-11-21 | Discharge: 2016-11-21 | Disposition: A | Discharge status: Home / Self Care | Payer: Medicaid Other | Source: Ambulatory Visit | Attending: Obstetrics | Admitting: Obstetrics

## 2016-11-21 DIAGNOSIS — Z3689 Encounter for other specified antenatal screening: Secondary | ICD-10-CM | POA: Insufficient documentation

## 2016-11-21 DIAGNOSIS — Z3480 Encounter for supervision of other normal pregnancy, unspecified trimester: Secondary | ICD-10-CM

## 2016-11-21 DIAGNOSIS — O99332 Smoking (tobacco) complicating pregnancy, second trimester: Secondary | ICD-10-CM | POA: Insufficient documentation

## 2016-11-21 DIAGNOSIS — O99212 Obesity complicating pregnancy, second trimester: Secondary | ICD-10-CM | POA: Diagnosis not present

## 2016-11-21 DIAGNOSIS — Z3A18 18 weeks gestation of pregnancy: Secondary | ICD-10-CM | POA: Diagnosis not present

## 2016-11-21 DIAGNOSIS — Z3482 Encounter for supervision of other normal pregnancy, second trimester: Secondary | ICD-10-CM | POA: Diagnosis present

## 2016-11-26 ENCOUNTER — Encounter: Payer: Medicaid Other | Admitting: Obstetrics

## 2016-12-01 ENCOUNTER — Encounter: Payer: Self-pay | Admitting: Obstetrics

## 2016-12-01 ENCOUNTER — Ambulatory Visit (INDEPENDENT_AMBULATORY_CARE_PROVIDER_SITE_OTHER): Payer: Medicaid Other | Admitting: Obstetrics

## 2016-12-01 ENCOUNTER — Other Ambulatory Visit: Payer: Self-pay | Admitting: Obstetrics

## 2016-12-01 VITALS — BP 108/73 | HR 87 | Wt 199.8 lb

## 2016-12-01 DIAGNOSIS — Z3482 Encounter for supervision of other normal pregnancy, second trimester: Secondary | ICD-10-CM

## 2016-12-01 DIAGNOSIS — IMO0002 Reserved for concepts with insufficient information to code with codable children: Secondary | ICD-10-CM

## 2016-12-01 DIAGNOSIS — Z3480 Encounter for supervision of other normal pregnancy, unspecified trimester: Secondary | ICD-10-CM

## 2016-12-01 DIAGNOSIS — Z0489 Encounter for examination and observation for other specified reasons: Secondary | ICD-10-CM

## 2016-12-01 DIAGNOSIS — F5101 Primary insomnia: Secondary | ICD-10-CM

## 2016-12-01 NOTE — Progress Notes (Signed)
Subjective:  [name] is a 36 y.o. [phone] at [redacted]w[redacted]d being seen today for ongoing prenatal care.  She is currently monitored for the following issues for this low-risk pregnancy and has Low grade squamous intraepithelial lesion (LGSIL) on Papanicolaou smear of cervix; Supervision of normal pregnancy, antepartum; and Rubella non-immune status, antepartum on their problem list.  Patient reports insomnia.  Contractions: Not present. Vag. Bleeding: None.  Movement: Present. Denies leaking of fluid.   The following portions of the patient's history were reviewed and updated as appropriate: allergies, current medications, past family history, past medical history, past social history, past surgical history and problem list. Problem list updated.  Objective:   Vitals:   12/01/16 1422  BP: 108/73  Pulse: 87  Weight: 199 lb 12.8 oz (90.6 kg)    Fetal Status:     Movement: Present     General:  Alert, oriented and cooperative. Patient is in no acute distress.  Skin: Skin is warm and dry. No rash noted.   Cardiovascular: Normal heart rate noted  Respiratory: Normal respiratory effort, no problems with respiration noted  Abdomen: Soft, gravid, appropriate for gestational age. Pain/Pressure: Absent     Pelvic:  Cervical exam deferred        Extremities: Normal range of motion.  Edema: None  Mental Status: Normal mood and affect. Normal behavior. Normal judgment and thought content.   Urinalysis:      Assessment and Plan:  Pregnancy: [id number] at 375w3d  1. Supervision of other normal pregnancy, antepartum - doing well  Preterm labor symptoms and general obstetric precautions including but not limited to vaginal bleeding, contractions, leaking of fluid and fetal movement were reviewed in detail with the patient. Please refer to After Visit Summary for other counseling recommendations.  Return in about 4 weeks (around 12/29/2016) for ROB.   Brock BadHarper, Krystol Rocco A, MD

## 2016-12-01 NOTE — Progress Notes (Signed)
Patient reports good fetal movement, denies pain. 

## 2016-12-02 ENCOUNTER — Other Ambulatory Visit: Payer: Self-pay

## 2016-12-02 DIAGNOSIS — F5101 Primary insomnia: Secondary | ICD-10-CM

## 2016-12-02 MED ORDER — ZOLPIDEM TARTRATE 5 MG PO TABS: 5.0000 mg | ORAL_TABLET | Freq: Every evening | ORAL | 2 refills | Status: DC | PRN

## 2016-12-31 ENCOUNTER — Ambulatory Visit (INDEPENDENT_AMBULATORY_CARE_PROVIDER_SITE_OTHER): Payer: Medicaid Other | Admitting: Obstetrics

## 2016-12-31 ENCOUNTER — Encounter: Payer: Self-pay | Admitting: Obstetrics

## 2016-12-31 VITALS — BP 119/80 | HR 88 | Wt 199.0 lb

## 2016-12-31 DIAGNOSIS — B373 Candidiasis of vulva and vagina: Secondary | ICD-10-CM

## 2016-12-31 DIAGNOSIS — Z3480 Encounter for supervision of other normal pregnancy, unspecified trimester: Secondary | ICD-10-CM

## 2016-12-31 DIAGNOSIS — Z3482 Encounter for supervision of other normal pregnancy, second trimester: Secondary | ICD-10-CM

## 2016-12-31 DIAGNOSIS — B3731 Acute candidiasis of vulva and vagina: Secondary | ICD-10-CM

## 2016-12-31 MED ORDER — TERCONAZOLE 0.4 % VA CREA: 1.0000 | TOPICAL_CREAM | Freq: Every day | VAGINAL | 2 refills | Status: DC

## 2016-12-31 NOTE — Progress Notes (Signed)
Pt states that she is having increase in vaginal irritation after intercourse.  Pt has been treated for multiple yeast inf this pregnancy.

## 2016-12-31 NOTE — Progress Notes (Signed)
Subjective:  [name] is a 36 y.o. [phone] at 5115w5d being seen today for ongoing prenatal care.  She is currently monitored for the following issues for this low-risk pregnancy and has Low grade squamous intraepithelial lesion (LGSIL) on Papanicolaou smear of cervix; Supervision of normal pregnancy, antepartum; and Rubella non-immune status, antepartum on their problem list.  Patient reports vaginal irritation.  Contractions: Not present. Vag. Bleeding: None.  Movement: Present. Denies leaking of fluid.   The following portions of the patient's history were reviewed and updated as appropriate: allergies, current medications, past family history, past medical history, past social history, past surgical history and problem list. Problem list updated.  Objective:   Vitals:   12/31/16 1026  BP: 119/80  Pulse: 88  Weight: 199 lb (90.3 kg)    Fetal Status: Fetal Heart Rate (bpm): 140   Movement: Present     General:  Alert, oriented and cooperative. Patient is in no acute distress.  Skin: Skin is warm and dry. No rash noted.   Cardiovascular: Normal heart rate noted  Respiratory: Normal respiratory effort, no problems with respiration noted  Abdomen: Soft, gravid, appropriate for gestational age. Pain/Pressure: Present     Pelvic:  Cervical exam deferred        Extremities: Normal range of motion.     Mental Status: Normal mood and affect. Normal behavior. Normal judgment and thought content.   Urinalysis:      Assessment and Plan:  Pregnancy: [id number] at 4315w5d  1. Supervision of other normal pregnancy, antepartum   2. Candida vaginitis Rx: - terconazole (TERAZOL 7) 0.4 % vaginal cream; Place 1 applicator vaginally at bedtime.  Dispense: 45 g; Refill: 2  Term labor symptoms and general obstetric precautions including but not limited to vaginal bleeding, contractions, leaking of fluid and fetal movement were reviewed in detail with the patient. Please refer to After Visit  Summary for other counseling recommendations.  Return in about 4 weeks (around 01/28/2017) for ROB, 2 hour OGTT.   Brock BadHarper, Morningstar Toft A, MD

## 2017-01-28 ENCOUNTER — Ambulatory Visit (INDEPENDENT_AMBULATORY_CARE_PROVIDER_SITE_OTHER): Payer: Medicaid Other | Admitting: Obstetrics

## 2017-01-28 ENCOUNTER — Other Ambulatory Visit: Payer: Medicaid Other

## 2017-01-28 ENCOUNTER — Other Ambulatory Visit: Payer: Self-pay

## 2017-01-28 ENCOUNTER — Encounter: Payer: Self-pay | Admitting: Obstetrics

## 2017-01-28 VITALS — BP 113/72 | HR 89 | Wt 200.0 lb

## 2017-01-28 DIAGNOSIS — Z3480 Encounter for supervision of other normal pregnancy, unspecified trimester: Secondary | ICD-10-CM

## 2017-01-28 DIAGNOSIS — M62838 Other muscle spasm: Secondary | ICD-10-CM

## 2017-01-28 DIAGNOSIS — Z3483 Encounter for supervision of other normal pregnancy, third trimester: Secondary | ICD-10-CM

## 2017-01-28 MED ORDER — CYCLOBENZAPRINE HCL 10 MG PO TABS: 10.0000 mg | ORAL_TABLET | Freq: Three times a day (TID) | ORAL | 2 refills | Status: DC | PRN

## 2017-01-28 NOTE — Progress Notes (Signed)
Subjective:  [name] is a 37 y.o. [phone] at 4369w5d being seen today for ongoing prenatal care.  She is currently monitored for the following issues for this low-risk pregnancy and has Low grade squamous intraepithelial lesion (LGSIL) on Papanicolaou smear of cervix; Supervision of normal pregnancy, antepartum; and Rubella non-immune status, antepartum on their problem list.  Patient reports muscle spasm of right arm.  Contractions: Not present. Vag. Bleeding: None.  Movement: Present. Denies leaking of fluid.   The following portions of the patient's history were reviewed and updated as appropriate: allergies, current medications, past family history, past medical history, past social history, past surgical history and problem list. Problem list updated.  Objective:   Vitals:   01/28/17 0944  BP: 113/72  Pulse: 89  Weight: 200 lb (90.7 kg)    Fetal Status: Fetal Heart Rate (bpm): 140   Movement: Present     General:  Alert, oriented and cooperative. Patient is in no acute distress.  Skin: Skin is warm and dry. No rash noted.   Cardiovascular: Normal heart rate noted  Respiratory: Normal respiratory effort, no problems with respiration noted  Abdomen: Soft, gravid, appropriate for gestational age. Pain/Pressure: Present     Pelvic:  Cervical exam deferred        Extremities: Normal range of motion.  Edema: Trace  Mental Status: Normal mood and affect. Normal behavior. Normal judgment and thought content.   Urinalysis:      Assessment and Plan:  Pregnancy: [id number] at 369w5d  1. Supervision of other normal pregnancy, antepartum Rx: - Glucose Tolerance, 2 Hours w/1 Hour - CBC - HIV antibody (with reflex) - RPR  2. Muscle spasm of right shoulder Rx: - cyclobenzaprine (FLEXERIL) 10 MG tablet; Take 1 tablet (10 mg total) by mouth every 8 (eight) hours as needed for muscle spasms.  Dispense: 30 tablet; Refill: 2  Preterm labor symptoms and general obstetric precautions  including but not limited to vaginal bleeding, contractions, leaking of fluid and fetal movement were reviewed in detail with the patient. Please refer to After Visit Summary for other counseling recommendations.  Return in about 2 weeks (around 02/11/2017) for ROB.   Brock BadHarper, Akesha Uresti A, MD

## 2017-01-28 NOTE — Progress Notes (Signed)
C/o right arm and hand pain 8/10 everyday x 1 week.  Declined TDAP.

## 2017-01-29 ENCOUNTER — Other Ambulatory Visit: Payer: Self-pay | Admitting: Obstetrics

## 2017-01-29 DIAGNOSIS — O24410 Gestational diabetes mellitus in pregnancy, diet controlled: Secondary | ICD-10-CM

## 2017-01-29 LAB — CBC
Hematocrit: 33.2 % — ABNORMAL LOW (ref 34.0–46.6)
Hemoglobin: 10.2 g/dL — ABNORMAL LOW (ref 11.1–15.9)
MCH: 24 pg — AB (ref 26.6–33.0)
MCHC: 30.7 g/dL — AB (ref 31.5–35.7)
MCV: 78 fL — AB (ref 79–97)
Platelets: 264 10*3/uL (ref 150–379)
RBC: 4.25 x10E6/uL (ref 3.77–5.28)
RDW: 16.9 % — AB (ref 12.3–15.4)
WBC: 6.3 10*3/uL (ref 3.4–10.8)

## 2017-01-29 LAB — HIV ANTIBODY (ROUTINE TESTING W REFLEX): HIV Screen 4th Generation wRfx: NONREACTIVE

## 2017-01-29 LAB — GLUCOSE TOLERANCE, 2 HOURS W/ 1HR
Glucose, 1 hour: 180 mg/dL — ABNORMAL HIGH (ref 65–179)
Glucose, 2 hour: 134 mg/dL (ref 65–152)
Glucose, Fasting: 88 mg/dL (ref 65–91)

## 2017-01-29 LAB — RPR: RPR: NONREACTIVE

## 2017-02-02 ENCOUNTER — Ambulatory Visit (HOSPITAL_COMMUNITY)
Admission: RE | Admit: 2017-02-02 | Discharge: 2017-02-02 | Disposition: A | Discharge status: Home / Self Care | Payer: Medicaid Other | Source: Ambulatory Visit | Attending: Obstetrics | Admitting: Obstetrics

## 2017-02-02 ENCOUNTER — Other Ambulatory Visit: Payer: Self-pay | Admitting: Obstetrics

## 2017-02-02 ENCOUNTER — Other Ambulatory Visit (HOSPITAL_COMMUNITY): Payer: Self-pay | Admitting: *Deleted

## 2017-02-02 DIAGNOSIS — O99333 Smoking (tobacco) complicating pregnancy, third trimester: Secondary | ICD-10-CM

## 2017-02-02 DIAGNOSIS — Z0489 Encounter for examination and observation for other specified reasons: Secondary | ICD-10-CM

## 2017-02-02 DIAGNOSIS — Z362 Encounter for other antenatal screening follow-up: Secondary | ICD-10-CM | POA: Diagnosis not present

## 2017-02-02 DIAGNOSIS — O365930 Maternal care for other known or suspected poor fetal growth, third trimester, not applicable or unspecified: Secondary | ICD-10-CM | POA: Diagnosis not present

## 2017-02-02 DIAGNOSIS — Z3A29 29 weeks gestation of pregnancy: Secondary | ICD-10-CM

## 2017-02-02 DIAGNOSIS — IMO0002 Reserved for concepts with insufficient information to code with codable children: Secondary | ICD-10-CM

## 2017-02-02 DIAGNOSIS — O365990 Maternal care for other known or suspected poor fetal growth, unspecified trimester, not applicable or unspecified: Secondary | ICD-10-CM

## 2017-02-02 DIAGNOSIS — O99212 Obesity complicating pregnancy, second trimester: Secondary | ICD-10-CM

## 2017-02-02 DIAGNOSIS — O99213 Obesity complicating pregnancy, third trimester: Secondary | ICD-10-CM | POA: Diagnosis present

## 2017-02-02 DIAGNOSIS — Z3A28 28 weeks gestation of pregnancy: Secondary | ICD-10-CM

## 2017-02-02 DIAGNOSIS — Z3480 Encounter for supervision of other normal pregnancy, unspecified trimester: Secondary | ICD-10-CM

## 2017-02-04 ENCOUNTER — Telehealth: Payer: Self-pay | Admitting: Pediatrics

## 2017-02-04 DIAGNOSIS — O24410 Gestational diabetes mellitus in pregnancy, diet controlled: Secondary | ICD-10-CM

## 2017-02-04 DIAGNOSIS — O24429 Gestational diabetes mellitus in childbirth, unspecified control: Secondary | ICD-10-CM | POA: Insufficient documentation

## 2017-02-04 MED ORDER — GLUCOSE BLOOD VI STRP: ORAL_STRIP | 12 refills | Status: DC

## 2017-02-04 MED ORDER — ACCU-CHEK GUIDE W/DEVICE KIT: 1.0000 | PACK | 0 refills | Status: AC

## 2017-02-04 MED ORDER — ACCU-CHEK MULTICLIX LANCETS MISC: 12 refills | Status: DC

## 2017-02-04 NOTE — Telephone Encounter (Signed)
See result note.  Pt aware of diagnosis.  She voiced understanding of plan and recommendations.

## 2017-02-04 NOTE — Telephone Encounter (Signed)
-----   Message from Brock Badharles A Harper, MD sent at 01/29/2017  8:22 AM EST ----- Abnormal @ hr. GTT Start Diabetic Teaching, ADA Diet, and order Glucometer, Lancets and Test Strips. [facility]

## 2017-02-11 ENCOUNTER — Encounter: Payer: Self-pay | Admitting: Obstetrics

## 2017-02-11 ENCOUNTER — Ambulatory Visit (INDEPENDENT_AMBULATORY_CARE_PROVIDER_SITE_OTHER): Payer: Medicaid Other | Admitting: Obstetrics

## 2017-02-11 VITALS — BP 102/68 | HR 85 | Wt 198.1 lb

## 2017-02-11 DIAGNOSIS — O24410 Gestational diabetes mellitus in pregnancy, diet controlled: Secondary | ICD-10-CM

## 2017-02-11 NOTE — Progress Notes (Signed)
Patient reports good fetal movement, complains of occasional pressure, denies contractions. Pt states that she originally declined diabetic teaching b/c she did not know what it was for, but she know has supplies and appt scheduled.

## 2017-02-11 NOTE — Progress Notes (Signed)
Subjective:  [name] is a 37 y.o. [name] at 3157w6d being seen today for ongoing prenatal care.  She is currently monitored for the following issues for this low-risk pregnancy and has Low grade squamous intraepithelial lesion (LGSIL) on Papanicolaou smear of cervix; Supervision of normal pregnancy, antepartum; Rubella non-immune status, antepartum; and White classification A1 gestational diabetes mellitus (GDM), diet controlled on their problem list.  Patient reports backache.  Contractions: Not present. Vag. Bleeding: None.  Movement: Present. Denies leaking of fluid.   The following portions of the patient's history were reviewed and updated as appropriate: allergies, current medications, past family history, past medical history, past social history, past surgical history and problem list. Problem list updated.  Objective:   Vitals:   02/11/17 1036  BP: 102/68  Pulse: 85  Weight: 198 lb 1.6 oz (89.9 kg)    Fetal Status:     Movement: Present     General:  Alert, oriented and cooperative. Patient is in no acute distress.  Skin: Skin is warm and dry. No rash noted.   Cardiovascular: Normal heart rate noted  Respiratory: Normal respiratory effort, no problems with respiration noted  Abdomen: Soft, gravid, appropriate for gestational age. Pain/Pressure: Present     Pelvic:  Cervical exam deferred        Extremities: Normal range of motion.  Edema: Trace  Mental Status: Normal mood and affect. Normal behavior. Normal judgment and thought content.   Urinalysis:      Assessment and Plan:  Pregnancy: [id number] at 5257w6d  1. White classification A1 gestational diabetes mellitus (GDM), diet controlled   Preterm labor symptoms and general obstetric precautions including but not limited to vaginal bleeding, contractions, leaking of fluid and fetal movement were reviewed in detail with the patient. Please refer to After Visit Summary for other counseling recommendations.  Return in  about 2 weeks (around 02/25/2017) for ROB.   Brock BadHarper, Tajia Szeliga A, MD

## 2017-02-18 ENCOUNTER — Ambulatory Visit: Payer: Medicaid Other | Admitting: Registered"

## 2017-02-23 ENCOUNTER — Encounter (HOSPITAL_COMMUNITY): Payer: Self-pay

## 2017-02-23 ENCOUNTER — Other Ambulatory Visit (HOSPITAL_COMMUNITY): Payer: Self-pay | Admitting: *Deleted

## 2017-02-23 ENCOUNTER — Ambulatory Visit (HOSPITAL_COMMUNITY)
Admission: RE | Admit: 2017-02-23 | Discharge: 2017-02-23 | Disposition: A | Discharge status: Home / Self Care | Payer: Medicaid Other | Source: Ambulatory Visit | Attending: Obstetrics | Admitting: Obstetrics

## 2017-02-23 ENCOUNTER — Other Ambulatory Visit (HOSPITAL_COMMUNITY): Payer: Self-pay | Admitting: Maternal and Fetal Medicine

## 2017-02-23 DIAGNOSIS — O99213 Obesity complicating pregnancy, third trimester: Secondary | ICD-10-CM | POA: Diagnosis not present

## 2017-02-23 DIAGNOSIS — Z3A31 31 weeks gestation of pregnancy: Secondary | ICD-10-CM | POA: Diagnosis not present

## 2017-02-23 DIAGNOSIS — O365930 Maternal care for other known or suspected poor fetal growth, third trimester, not applicable or unspecified: Secondary | ICD-10-CM | POA: Diagnosis present

## 2017-02-23 DIAGNOSIS — O99333 Smoking (tobacco) complicating pregnancy, third trimester: Secondary | ICD-10-CM | POA: Insufficient documentation

## 2017-02-23 DIAGNOSIS — Z3480 Encounter for supervision of other normal pregnancy, unspecified trimester: Secondary | ICD-10-CM

## 2017-02-23 DIAGNOSIS — O9989 Other specified diseases and conditions complicating pregnancy, childbirth and the puerperium: Secondary | ICD-10-CM

## 2017-02-23 DIAGNOSIS — O09899 Supervision of other high risk pregnancies, unspecified trimester: Secondary | ICD-10-CM

## 2017-02-23 DIAGNOSIS — Z283 Underimmunization status: Secondary | ICD-10-CM

## 2017-02-25 ENCOUNTER — Ambulatory Visit (INDEPENDENT_AMBULATORY_CARE_PROVIDER_SITE_OTHER): Payer: Medicaid Other | Admitting: Obstetrics

## 2017-02-25 ENCOUNTER — Encounter: Payer: Self-pay | Admitting: Obstetrics

## 2017-02-25 VITALS — BP 104/68 | HR 79 | Wt 199.4 lb

## 2017-02-25 DIAGNOSIS — Z3490 Encounter for supervision of normal pregnancy, unspecified, unspecified trimester: Secondary | ICD-10-CM

## 2017-02-25 DIAGNOSIS — M545 Low back pain, unspecified: Secondary | ICD-10-CM

## 2017-02-25 DIAGNOSIS — Z3483 Encounter for supervision of other normal pregnancy, third trimester: Secondary | ICD-10-CM

## 2017-02-25 MED ORDER — COMFORT FIT MATERNITY SUPP SM MISC: 0 refills | Status: DC

## 2017-02-25 NOTE — Progress Notes (Signed)
Subjective:  [name] is a 37 y.o. [phone] at [redacted]w[redacted]d being seen today for ongoing prenatal care.  She is currently monitored for the following issues for this high-risk pregnancy and has Low grade squamous intraepithelial lesion (LGSIL) on Papanicolaou smear of cervix; Supervision of normal pregnancy, antepartum; Rubella non-immune status, antepartum; and White classification A1 gestational diabetes mellitus (GDM), diet controlled on their problem list.  Patient reports occasional contractions.and backache  Contractions: Irritability. Vag. Bleeding: None.  Movement: Present. Denies leaking of fluid.   The following portions of the patient's history were reviewed and updated as appropriate: allergies, current medications, past family history, past medical history, past social history, past surgical history and problem list. Problem list updated.  Objective:   Vitals:   02/25/17 1047  BP: 104/68  Pulse: 79  Weight: 199 lb 6.4 oz (90.4 kg)    Fetal Status:     Movement: Present     General:  Alert, oriented and cooperative. Patient is in no acute distress.  Skin: Skin is warm and dry. No rash noted.   Cardiovascular: Normal heart rate noted  Respiratory: Normal respiratory effort, no problems with respiration noted  Abdomen: Soft, gravid, appropriate for gestational age. Pain/Pressure: Present     Pelvic:  Cervical exam deferred        Extremities: Normal range of motion.  Edema: Trace  Mental Status: Normal mood and affect. Normal behavior. Normal judgment and thought content.   Urinalysis:        ULTRASOUND FOR HISTORY OF POOR FETAL GROWTH AND TOBACCO DEPENDENCY: [place] MFM OB FOLLOW UP (Accession [id number]) (Order [id number])  Imaging  Date: 02/23/2017 Department: [facility] MATERNAL FETAL CARE ULTRASOUND Released By: Marcellina Millin, RTR Authorizing: Particia Nearing, MD  Exam Information   Status Exam Begun  Exam Ended   Final [99] 02/23/2017 11:41 AM 02/23/2017 12:54 PM   PACS Images   Show images for [place] MFM OB FOLLOW UP  Study Result   ----------------------------------------------------------------------  OBSTETRICS REPORT                      (Signed Final 02/23/2017 12:39 pm) ---------------------------------------------------------------------- Patient Info  ID #:       [id number]                          D.O.B.:  [birth year] (36 yrs)  Name:       [name]               Visit Date: 02/23/2017 12:17 pm ---------------------------------------------------------------------- Performed By  Performed By:     Marcellina Millin          Ref. Address:     [address]                    [facility]                                                             [address]  [place], [place]                                                             [address]  Attending:        Durwin Nora       Location:         [facility]                    MD  Referred By:      Brock Bad MD ---------------------------------------------------------------------- Orders   #  Description                                 Code   1  [place] MFM OB FOLLOW UP                         [phone]  ----------------------------------------------------------------------   #  Ordered By               Order #        Accession #    Episode #   1  Particia Nearing            [id number]      [id number]     [id number]  ---------------------------------------------------------------------- Indications   [redacted] weeks gestation of pregnancy                Z3A.31   Obesity complicating pregnancy, third          O99.213   trimester   Tobacco use complicating pregnancy, third      O99.333   trimester   Small for gestational age fetus affecting      O66.5990   management of mother  ---------------------------------------------------------------------- OB History  Gravidity:    6         Term:   3        Prem:   0         SAB:   1  TOP:          1       Ectopic:  0        Living: 3 ---------------------------------------------------------------------- Fetal Evaluation  Num Of Fetuses:     1  Fetal Heart         142  Rate(bpm):  Cardiac Activity:   Observed  Presentation:       Cephalic  Placenta:           Anterior, above cervical os  P. Cord Insertion:  Previously Visualized  Amniotic Fluid  AFI FV:      Subjectively within normal limits  AFI Sum(cm)     %Tile       Largest Pocket(cm)  11.7            29          3.1  RUQ(cm)       RLQ(cm)       LUQ(cm)        LLQ(cm)  3.1           3.1  2.6            2.9 ---------------------------------------------------------------------- Biometry  BPD:      83.3  mm     G. Age:  33w 4d         90  %    CI:        75.54   %    70 - 86                                                          FL/HC:      18.8   %    19.1 - 21.3  HC:      303.9  mm     G. Age:  33w 6d         74  %    HC/AC:      1.09        0.96 - 1.17  AC:       278   mm     G. Age:  31w 6d         56  %    FL/BPD:     68.7   %    71 - 87  FL:       57.2  mm     G. Age:  30w 0d          7  %    FL/AC:      20.6   %    20 - 24  Est. FW:    1801  gm           4 lb     56  % ---------------------------------------------------------------------- Gestational Age  LMP:           32w 3d        Date:  07/11/16                 EDD:   04/17/17  U/S Today:     32w 2d                                        EDD:   04/18/17  Best:          31w 4d     Det. By:  Marcella DubsEarly Ultrasound         EDD:   04/23/17                                      (09/09/16) ---------------------------------------------------------------------- Anatomy  Cranium:               Appears normal         Aortic Arch:            Previously seen  Cavum:                 Appears normal         Ductal Arch:            Previously seen  Ventricles:            Appears normal         Diaphragm:  Previously seen  Choroid Plexus:         Resolved CPC           Stomach:                Appears normal, left                                                                        sided  Cerebellum:            Previously seen        Abdomen:                Appears normal  Posterior Fossa:       Previously seen        Abdominal Wall:         Previously seen  Nuchal Fold:           Previously seen        Cord Vessels:           Previously seen  Face:                  Appears normal         Kidneys:                Appear normal                         (orbits and profile)  Lips:                  Previously seen        Bladder:                Appears normal  Thoracic:              Appears normal         Spine:                  Limited views                                                                        previously seen  Heart:                 Previously seen        Upper Extremities:      Previously seen  RVOT:                  Previously seen        Lower Extremities:      Previously seen  LVOT:                  Previously seen  Other:  Gender not well visualized. Heels prev. visualized. Technically difficult          due to fetal position. ---------------------------------------------------------------------- Cervix Uterus Adnexa  Cervix  Not visualized (advanced GA >29wks) ---------------------------------------------------------------------- Impression  Single living intrauterine pregnancy at 31w  4d.  Cephalic presentation.  Placenta Anterior, above cervical os.  Normal amniotic fluid volume.  Appropriate interval fetal growth.  Normal interval fetal anatomy. ---------------------------------------------------------------------- Recommendations  Given smoking and prior lagging growth, interval growth in 4-  6 weeks. ----------------------------------------------------------------------               Durwin Nora, MD Electronically Signed Final Report   02/23/2017 12:39  pm ----------------------------------------------------------------------    Assessment and Plan:  Pregnancy: [id number] at [redacted]w[redacted]d  1. Encounter for supervision of normal pregnancy, antepartum, unspecified gravidity  2. Acute midline low back pain without sciatica Rx: - Elastic Bandages & Supports (COMFORT FIT MATERNITY SUPP SM) MISC; Wear as directed.  Dispense: 1 each; Refill: 0   Preterm labor symptoms and general obstetric precautions including but not limited to vaginal bleeding, contractions, leaking of fluid and fetal movement were reviewed in detail with the patient. Please refer to After Visit Summary for other counseling recommendations.  Return in about 2 weeks (around 03/11/2017) for ROB.   Brock Bad, MD

## 2017-03-13 ENCOUNTER — Encounter: Payer: Self-pay | Admitting: *Deleted

## 2017-03-16 ENCOUNTER — Ambulatory Visit (INDEPENDENT_AMBULATORY_CARE_PROVIDER_SITE_OTHER): Payer: Medicaid Other | Admitting: Obstetrics

## 2017-03-16 ENCOUNTER — Encounter: Payer: Self-pay | Admitting: Obstetrics

## 2017-03-16 VITALS — BP 111/79 | HR 92 | Wt 195.2 lb

## 2017-03-16 DIAGNOSIS — Z3490 Encounter for supervision of normal pregnancy, unspecified, unspecified trimester: Secondary | ICD-10-CM

## 2017-03-16 DIAGNOSIS — O24410 Gestational diabetes mellitus in pregnancy, diet controlled: Secondary | ICD-10-CM

## 2017-03-16 DIAGNOSIS — Z3A34 34 weeks gestation of pregnancy: Secondary | ICD-10-CM

## 2017-03-16 DIAGNOSIS — K219 Gastro-esophageal reflux disease without esophagitis: Secondary | ICD-10-CM

## 2017-03-16 DIAGNOSIS — F17210 Nicotine dependence, cigarettes, uncomplicated: Secondary | ICD-10-CM

## 2017-03-16 NOTE — Progress Notes (Signed)
Subjective:  [name] is a 37 y.o. [phone] at 3551w4d being seen today for ongoing prenatal care.  She is currently monitored for the following issues for this high-risk pregnancy and has Low grade squamous intraepithelial lesion (LGSIL) on Papanicolaou smear of cervix; Supervision of normal pregnancy, antepartum; Rubella non-immune status, antepartum; and White classification A1 gestational diabetes mellitus (GDM), diet controlled on their problem list.  Patient reports occasional contractions and pressure.  Contractions: Irregular. Vag. Bleeding: None.  Movement: Present. Denies leaking of fluid.   The following portions of the patient's history were reviewed and updated as appropriate: allergies, current medications, past family history, past medical history, past social history, past surgical history and problem list. Problem list updated.  Objective:   Vitals:   03/16/17 1047  BP: 111/79  Pulse: 92  Weight: 195 lb 3.2 oz (88.5 kg)    Fetal Status: Fetal Heart Rate (bpm): 140   Movement: Present     General:  Alert, oriented and cooperative. Patient is in no acute distress.  Skin: Skin is warm and dry. No rash noted.   Cardiovascular: Normal heart rate noted  Respiratory: Normal respiratory effort, no problems with respiration noted  Abdomen: Soft, gravid, appropriate for gestational age. Pain/Pressure: Present     Pelvic:  Cervical exam deferred        Extremities: Normal range of motion.  Edema: Trace  Mental Status: Normal mood and affect. Normal behavior. Normal judgment and thought content.   Urinalysis:      Assessment and Plan:  Pregnancy: [id number] at 3151w4d  1. Encounter for supervision of normal pregnancy, antepartum, unspecified gravidity  2. White classification A1 gestational diabetes mellitus (GDM), diet controlled  - glucose levels well controlled  Preterm labor symptoms and general obstetric precautions including but not limited to vaginal bleeding,  contractions, leaking of fluid and fetal movement were reviewed in detail with the patient. Please refer to After Visit Summary for other counseling recommendations.  Return in about 1 week (around 03/23/2017) for ROB.   Brock BadHarper, Loella Hickle A, MD

## 2017-03-23 ENCOUNTER — Other Ambulatory Visit (HOSPITAL_COMMUNITY): Payer: Self-pay | Admitting: Obstetrics and Gynecology

## 2017-03-23 ENCOUNTER — Other Ambulatory Visit (HOSPITAL_COMMUNITY)
Admission: RE | Admit: 2017-03-23 | Discharge: 2017-03-23 | Disposition: A | Discharge status: Home / Self Care | Payer: Medicaid Other | Source: Ambulatory Visit | Attending: Certified Nurse Midwife | Admitting: Certified Nurse Midwife

## 2017-03-23 ENCOUNTER — Encounter (HOSPITAL_COMMUNITY): Payer: Self-pay

## 2017-03-23 ENCOUNTER — Ambulatory Visit (HOSPITAL_COMMUNITY)
Admission: RE | Admit: 2017-03-23 | Discharge: 2017-03-23 | Disposition: A | Discharge status: Home / Self Care | Payer: Medicaid Other | Source: Ambulatory Visit | Attending: Obstetrics | Admitting: Obstetrics

## 2017-03-23 ENCOUNTER — Ambulatory Visit (INDEPENDENT_AMBULATORY_CARE_PROVIDER_SITE_OTHER): Payer: Medicaid Other | Admitting: Certified Nurse Midwife

## 2017-03-23 ENCOUNTER — Encounter: Payer: Self-pay | Admitting: Certified Nurse Midwife

## 2017-03-23 VITALS — BP 108/73 | HR 108 | Wt 194.8 lb

## 2017-03-23 DIAGNOSIS — Z3A31 31 weeks gestation of pregnancy: Secondary | ICD-10-CM | POA: Diagnosis not present

## 2017-03-23 DIAGNOSIS — O24410 Gestational diabetes mellitus in pregnancy, diet controlled: Secondary | ICD-10-CM

## 2017-03-23 DIAGNOSIS — O0990 Supervision of high risk pregnancy, unspecified, unspecified trimester: Secondary | ICD-10-CM

## 2017-03-23 DIAGNOSIS — O99213 Obesity complicating pregnancy, third trimester: Secondary | ICD-10-CM

## 2017-03-23 DIAGNOSIS — O99333 Smoking (tobacco) complicating pregnancy, third trimester: Secondary | ICD-10-CM | POA: Insufficient documentation

## 2017-03-23 DIAGNOSIS — O98819 Other maternal infectious and parasitic diseases complicating pregnancy, unspecified trimester: Secondary | ICD-10-CM | POA: Diagnosis not present

## 2017-03-23 DIAGNOSIS — N898 Other specified noninflammatory disorders of vagina: Secondary | ICD-10-CM | POA: Diagnosis present

## 2017-03-23 DIAGNOSIS — Z283 Underimmunization status: Secondary | ICD-10-CM

## 2017-03-23 DIAGNOSIS — O365930 Maternal care for other known or suspected poor fetal growth, third trimester, not applicable or unspecified: Secondary | ICD-10-CM

## 2017-03-23 DIAGNOSIS — Z2839 Other underimmunization status: Secondary | ICD-10-CM

## 2017-03-23 DIAGNOSIS — Z91199 Patient's noncompliance with other medical treatment and regimen due to unspecified reason: Secondary | ICD-10-CM

## 2017-03-23 DIAGNOSIS — Z9119 Patient's noncompliance with other medical treatment and regimen: Secondary | ICD-10-CM

## 2017-03-23 DIAGNOSIS — B373 Candidiasis of vulva and vagina: Secondary | ICD-10-CM | POA: Insufficient documentation

## 2017-03-23 DIAGNOSIS — O365130 Maternal care for known or suspected placental insufficiency, third trimester, not applicable or unspecified: Secondary | ICD-10-CM | POA: Insufficient documentation

## 2017-03-23 DIAGNOSIS — Z9889 Other specified postprocedural states: Secondary | ICD-10-CM

## 2017-03-23 DIAGNOSIS — O9989 Other specified diseases and conditions complicating pregnancy, childbirth and the puerperium: Secondary | ICD-10-CM

## 2017-03-23 NOTE — Progress Notes (Signed)
Pt complains of leaking clear fluids yesterday. She sates that this has stopped. She did not get evaluated.

## 2017-03-23 NOTE — Addendum Note (Signed)
Addended by: Hamilton CapriBURCH, Rosemarie Galvis J on: 03/23/2017 01:17 PM   Modules accepted: Orders

## 2017-03-23 NOTE — Progress Notes (Signed)
   PRENATAL VISIT NOTE  Subjective:  [name] is a 37 y.o. [phone] at 449w4d being seen today for ongoing prenatal care.  She is currently monitored for the following issues for this high-risk pregnancy and has Low grade squamous intraepithelial lesion (LGSIL) on Papanicolaou smear of cervix; Supervision of high risk pregnancy, antepartum; Rubella non-immune status, antepartum; and White classification A1 gestational diabetes mellitus (GDM), diet controlled on their problem list.  Patient reports no bleeding, no leaking and occasional contractions.  Contractions: Irritability. Vag. Bleeding: None.  Movement: Present. Denies leaking of fluid.   The following portions of the patient's history were reviewed and updated as appropriate: allergies, current medications, past family history, past medical history, past social history, past surgical history and problem list. Problem list updated.  Objective:   Vitals:   03/23/17 1123  BP: 108/73  Pulse: (!) 108  Weight: 194 lb 12.8 oz (88.4 kg)    Fetal Status: Fetal Heart Rate (bpm): 140; doppler Fundal Height: 35 cm Movement: Present  Presentation: Vertex  General:  Alert, oriented and cooperative. Patient is in no acute distress.  Skin: Skin is warm and dry. No rash noted.   Cardiovascular: Normal heart rate noted  Respiratory: Normal respiratory effort, no problems with respiration noted  Abdomen: Soft, gravid, appropriate for gestational age.  Pain/Pressure: Present     Pelvic: Cervical exam performed Dilation: 1 Effacement (%): 50 Station: -3  Extremities: Normal range of motion.  Edema: Trace  Mental Status:  Normal mood and affect. Normal behavior. Normal judgment and thought content.     Negative Nitrazine test    Random CBG: 108; states that she is fasting  Assessment and Plan:  Pregnancy: [id number] at 569w4d  1. Supervision of high risk pregnancy, antepartum     Medical non-compliance, is not doing the diet and refuses to  check her blood sugars.  A1C and random blood sugar today. Has F/U US scheduled today.  Has teenage daughters with her today; neither one will help check her blood sugars.  Kick counts reviewed in detail.    2. White classification A1 gestational diabetes mellitus (GDM), diet controlled     Medical non-compliance.   Preterm labor symptoms and general obstetric precautions including but not limited to vaginal bleeding, contractions, leaking of fluid and fetal movement were reviewed in detail with the patient. Please refer to After Visit Summary for other counseling recommendations.  [facility]30[facility]2017[facility] Needs to see FP MD here.   Roe Coombsachelle A Armando Bukhari, CNM

## 2017-03-23 NOTE — Patient Instructions (Addendum)
Before [facility] Before your baby arrives it is important to:  Have all of the supplies that you will need to care for your baby.  Know where to go if there is an emergency.  Discuss the baby's arrival with other family members.  What supplies will I need?  It is recommended that you have the following supplies: Large Items  Crib.  Crib mattress.  Rear-facing infant car seat. If possible, have a trained professional check to make sure that it is installed correctly.  Feeding  6-8 bottles that are 4-5 oz in size.  6-8 nipples.  Bottle brush.  Sterilizer, or a large pan or kettle with a lid.  A way to boil and cool water.  If you will be breastfeeding: ? Breast pump. ? Nipple cream. ? Nursing bra. ? Breast pads. ? Breast shields.  If you will be formula feeding: ? Formula. ? Measuring cups. ? Measuring spoons.  Bathing  Mild baby soap and baby shampoo.  Petroleum jelly.  Soft cloth towel and washcloth.  Hooded towel.  Cotton balls.  Bath basin.  Other Supplies  Rectal thermometer.  Bulb syringe.  Baby wipes or washcloths for diaper changes.  Diaper bag.  Changing pad.  Clothing, including one-piece outfits and pajamas.  Baby nail clippers.  Receiving blankets.  Mattress pad and sheets for the crib.  Night-light for the baby's room.  Baby monitor.  2 or 3 pacifiers.  Either 24-36 cloth diapers and waterproof diaper covers or a box of disposable diapers. You may need to use as many as 10-12 diapers per day.  How do I prepare for an emergency? Prepare for an emergency by:  Knowing how to get to the nearest hospital.  Listing the phone numbers of your baby's health care providers near your home phone and in your cell phone.  How do I prepare my family?  Decide how to handle visitors.  If you have other children: ? Talk with them about the baby coming home. Ask them how they feel about it. ? Read a book together about  being a new big brother or sister. ? Find ways to let them help you prepare for the new baby. ? Have someone ready to care for them while you are in the hospital. This information is not intended to replace advice given to you by your health care provider. Make sure you discuss any questions you have with your health care provider. Document Released: 12/06/2007 Document Revised: 05/31/2015 Document Reviewed: 11/30/2013 Elsevier Interactive Patient Education  2018 Industry.  Blood Glucose Monitoring, Adult Monitoring your blood sugar (glucose) helps you manage your diabetes. It also helps you and your health care provider determine how well your diabetes management plan is working. Blood glucose monitoring involves checking your blood glucose as often as directed, and keeping a record (log) of your results over time. Why should I monitor my blood glucose? Checking your blood glucose regularly can:  Help you understand how food, exercise, illnesses, and medicines affect your blood glucose.  Let you know what your blood glucose is at any time. You can quickly tell if you are having low blood glucose (hypoglycemia) or high blood glucose (hyperglycemia).  Help you and your health care provider adjust your medicines as needed.  When should I check my blood glucose? Follow instructions from your health care provider about how often to check your blood glucose. This may depend on:  The type of diabetes you have.  How well-controlled your  diabetes is.  Medicines you are taking.  If you have type 1 diabetes:  Check your blood glucose at least 2 times a day.  Also check your blood glucose: ? Before every insulin injection. ? Before and after exercise. ? Between meals. ? 2 hours after a meal. ? Occasionally between 2:00 a.m. and 3:00 a.m., as directed. ? Before potentially dangerous tasks, like driving or using heavy machinery. ? At bedtime.  You may need to check your blood glucose  more often, up to 6-10 times a day: ? If you use an insulin pump. ? If you need multiple daily injections (MDI). ? If your diabetes is not well-controlled. ? If you are ill. ? If you have a history of severe hypoglycemia. ? If you have a history of not knowing when your blood glucose is getting low (hypoglycemia unawareness). If you have type 2 diabetes:  If you take insulin or other diabetes medicines, check your blood glucose at least 2 times a day.  If you are on intensive insulin therapy, check your blood glucose at least 4 times a day. Occasionally, you may also need to check between 2:00 a.m. and 3:00 a.m., as directed.  Also check your blood glucose: ? Before and after exercise. ? Before potentially dangerous tasks, like driving or using heavy machinery.  You may need to check your blood glucose more often if: ? Your medicine is being adjusted. ? Your diabetes is not well-controlled. ? You are ill. What is a blood glucose log?  A blood glucose log is a record of your blood glucose readings. It helps you and your health care provider: ? Look for patterns in your blood glucose over time. ? Adjust your diabetes management plan as needed.  Every time you check your blood glucose, write down your result and notes about things that may be affecting your blood glucose, such as your diet and exercise for the day.  Most glucose meters store a record of glucose readings in the meter. Some meters allow you to download your records to a computer. How do I check my blood glucose? Follow these steps to get accurate readings of your blood glucose: Supplies needed   Blood glucose meter.  Test strips for your meter. Each meter has its own strips. You must use the strips that come with your meter.  A needle to prick your finger (lancet). Do not use lancets more than once.  A device that holds the lancet (lancing device).  A journal or log book to write down your  results. Procedure  Wash your hands with soap and water.  Prick the side of your finger (not the tip) with the lancet. Use a different finger each time.  Gently rub the finger until a small drop of blood appears.  Follow instructions that come with your meter for inserting the test strip, applying blood to the strip, and using your blood glucose meter.  Write down your result and any notes. Alternative testing sites  Some meters allow you to use areas of your body other than your finger (alternative sites) to test your blood.  If you think you may have hypoglycemia, or if you have hypoglycemia unawareness, do not use alternative sites. Use your finger instead.  Alternative sites may not be as accurate as the fingers, because blood flow is slower in these areas. This means that the result you get may be delayed, and it may be different from the result that you would get from your  finger.  The most common alternative sites are: ? Forearm. ? Thigh. ? Palm of the hand. Additional tips  Always keep your supplies with you.  If you have questions or need help, all blood glucose meters have a 24-hour "hotline" number that you can call. You may also contact your health care provider.  After you use a few boxes of test strips, adjust (calibrate) your blood glucose meter by following instructions that came with your meter. This information is not intended to replace advice given to you by your health care provider. Make sure you discuss any questions you have with your health care provider. Document Released: 12/26/2002 Document Revised: 07/13/2015 Document Reviewed: 06/04/2015 Elsevier Interactive Patient Education  2017 Elsevier Inc.  Gestational Diabetes Mellitus, Diagnosis Gestational diabetes (gestational diabetes mellitus) is a short-term (temporary) form of diabetes that can happen during pregnancy. It goes away after you give birth. It may be caused by one or both of these  problems:  Your body does not make enough of a hormone called insulin.  Your body does not respond in a normal way to insulin that it makes.  Insulin lets sugars (glucose) go into cells in the body. This gives you energy. If you have diabetes, sugars cannot get into cells. This causes high blood sugar (hyperglycemia). If diabetes is treated, it may not hurt you or your baby. Your doctor will set treatment goals for you. In general, you should have these blood sugar levels:  After not eating for a long time (fasting): 95 mg/dL (5.3 mmol/L).  After meals (postprandial): ? One hour after a meal: at or below 140 mg/dL (7.8 mmol/L). ? Two hours after a meal: at or below 120 mg/dL (6.7 mmol/L).  A1c (hemoglobin A1c) level: 6-6.5%.  Follow these instructions at home: Questions to Ask Your Doctor  You may want to ask these questions:  Do I need to meet with a diabetes educator?  Where can I find a support group for people with diabetes?  What equipment will I need to care for myself at home?  What diabetes medicines do I need? When should I take them?  How often do I need to check my blood sugar?  What number can I call if I have questions?  When is my next doctor's visit?  General instructions  Take over-the-counter and prescription medicines only as told by your doctor.  Stay at a healthy weight during pregnancy.  Keep all follow-up visits as told by your doctor. This is important. Contact a doctor if:  Your blood sugar is at or above 240 mg/dL (13.3 mmol/L).  Your blood sugar is at or above 200 mg/dL (11.1 mmol/L) and you have ketones in your pee (urine).  You have been sick or have had a fever for 2 days or more and you are not getting better.  You have any of these problems for more than 6 hours: ? You cannot eat or drink. ? You feel sick to your stomach (nauseous). ? You throw up (vomit). ? You have watery poop (diarrhea). Get help right away if:  Your blood  sugar is lower than 54 mg/dL (3 mmol/L).  You get confused.  You have trouble: ? Thinking clearly. ? Breathing.  Your baby moves less than normal.  You have: ? Moderate or large ketone levels in your pee (urine). ? Bleeding from your vagina. ? Unusual fluid coming from your vagina. ? Early contractions. These may feel like tightness in your belly. This information is  not intended to replace advice given to you by your health care provider. Make sure you discuss any questions you have with your health care provider. Document Released: 04/16/2015 Document Revised: 05/31/2015 Document Reviewed: 01/26/2015 Elsevier Interactive Patient Education  2018 [employer].  Gestational Diabetes Mellitus, Self Care When you have gestational diabetes (gestational diabetes mellitus), you must keep your blood sugar (glucose) under control. You can do this with:  Nutrition.  Exercise.  Lifestyle changes.  Medicines or insulin, if needed.  Support from your doctors and others.  How do I manage my blood sugar?  Check your blood sugar every day during pregnancy. Check it as often as told.  Call your doctor if your blood sugar is above your goal numbers for 2 tests in a row. Your doctor will set treatment goals for you. Try to have these blood sugars:  After not eating for a long time (after fasting): at or below 95 mg/dL (5.3 mmol/L).  After meals (postprandial): ? One hour after a meal: at or below 140 mg/dL (7.8 mmol/L). ? Two hours after a meal: at or below 120 mg/dL (6.7 mmol/L).  A1c (hemoglobin A1c) level: 6-6.5%.  What do I need to know about high blood sugar? High blood sugar is called hyperglycemia. Know the early signs of high blood sugar. Signs include:  Feeling: ? Thirsty. ? Hungry. ? Very tired.  Needing to pee (urinate) more than usual.  Blurry vision.  What do I need to know about low blood sugar? Low blood sugar is called hypoglycemia. This is when blood sugar  is at or below 70 mg/dL (3.9 mmol/L). Symptoms may include:  Feeling: ? Hungry. ? Worried or nervous (anxious). ? Sweaty or clammy. ? Confused. ? Dizzy. ? Sleepy. ? Sick to your stomach (nauseous).  Having: ? A fast heartbeat. ? A headache. ? A change in vision. ? Jerky movements that you cannot control (seizure). ? Nightmares. ? Tingling or no feeling (numbness) around the mouth, lips, or tongue.  Having trouble with: ? Talking. ? Paying attention (concentrating). ? Moving (coordination). ? Sleeping.  Shaking.  Passing out (fainting).  Getting upset easily (irritability).  Treating low blood sugar  To treat low blood sugar, eat or drink something sugary right away. If you can think clearly and swallow safely, follow the 15:15 rule:  Take 15 grams of a fast-acting carb (carbohydrate). Some fast-acting carbs are: ? 1 tube of glucose gel. ? 3 sugar tablets (glucose pills). ? 6-8 pieces of hard candy. ? 4 oz (120 mL) of fruit juice. ? 4 oz (120 mL) regular (not diet) soda.  Check your blood sugar 15 minutes after you take the carb.  If your blood sugar is still at or below 70 mg/dL (3.9 mmol/L), take 15 grams of a carb again.  If your blood sugar does not go above 70 mg/dL (3.9 mmol/L) after 3 tries, get help right away.  After your blood sugar goes back to normal, eat a meal or a snack within 1 hour.  Treating very low blood sugar If your blood sugar is at or below 54 mg/dL (3 mmol/L), you have very low blood sugar (severe hypoglycemia). This is an emergency. Do not wait to see if the symptoms will go away. Get medical help right away. Call your local emergency services (911 in the [place] Do not drive yourself to the hospital. If you have very low blood sugar and you cannot eat or drink, you may need a glucagon shot (injection). A  family member or friend should learn:  How to check your blood sugar.  How to give you a glucagon shot.  Ask your doctor if you need  a glucagon shot kit at home. What else is important to manage my diabetes? Medicine  Take your insulin and diabetes medicines as told.  Do not run out of insulin or medicines.  Adjust your insulin and diabetes medicines as told. Food   Make healthy food choices. These include: ? Chicken, fish, egg whites, and beans. ? Oats, whole wheat, bulgur, brown rice, quinoa, and millet. ? Fresh fruits and vegetables. ? Low-fat dairy products. ? Nuts, avocado, olive oil, and canola oil.  Make an eating plan. A food specialist (dietitian) can help you.  Follow instructions from your doctor about what you cannot eat or drink.  Drink enough fluid to keep your pee (urine) clear or pale yellow.  Eat healthy snacks between healthy meals.  Keep track of carbs you eat. Read food labels. Learn about food serving sizes.  Follow your sick day plan when you cannot eat or drink normally. Make this plan with your doctor. Activity  Exercise 30 minutes or more a day or as much as told by your doctor.  Talk with your doctor if you start a new exercise. Your doctor may need to adjust your insulin, medicines, or food. Lifestyle  Do not drink alcohol.  Do not use any tobacco products. If you need help quitting, ask your doctor.  Learn how to deal with stress. If you need help with this, ask your doctor. Body care   Stay up to date with your shots (immunizations).  Brush your teeth and gums two times a day. Floss at least one time a day.  Go to the dentist least one time every 6 months.  Stay at a healthy weight while you are pregnant. General instructions  Take over-the-counter and prescription medicines only as told by your doctor.  Ask your doctor about risks of high blood pressure in pregnancy. These are called preeclampsia and eclampsia.  Share your diabetes care plan with: ? Your work or school. ? People you live with.  Check your pee for ketones: ? When you are sick. ? As told  by your doctor.  Ask your doctor: ? Do I need to meet with a diabetes educator? ? Where can I find a support group for people with diabetes?  Carry a card or wear jewelry that says that you have diabetes.  Keep all follow-up visits with your doctor. This is important. Care after giving birth   Have your blood sugar checked 4-12 weeks after you give birth.  Get checked for diabetes at least every 3 years. Where to find more information: To learn more about diabetes, visit:  American Diabetes Association: www.diabetes.org/diabetes-basics/gestational  Centers for Disease Control and Prevention (CDC): http://sanchez-watson.com/.pdf  This information is not intended to replace advice given to you by your health care provider. Make sure you discuss any questions you have with your health care provider. Document Released: 04/16/2015 Document Revised: 05/31/2015 Document Reviewed: 01/26/2015 Elsevier Interactive Patient Education  2018 [employer].  Diabetes Mellitus and Nutrition When you have diabetes (diabetes mellitus), it is very important to have healthy eating habits because your blood sugar (glucose) levels are greatly affected by what you eat and drink. Eating healthy foods in the appropriate amounts, at about the same times every day, can help you:  Control your blood glucose.  Lower your risk of heart disease.  Improve your blood pressure.  Reach or maintain a healthy weight.  Every person with diabetes is different, and each person has different needs for a meal plan. Your health care provider may recommend that you work with a diet and nutrition specialist (dietitian) to make a meal plan that is best for you. Your meal plan may vary depending on factors such as:  The calories you need.  The medicines you take.  Your weight.  Your blood glucose, blood pressure, and cholesterol levels.  Your activity level.  Other health conditions you  have, such as heart or kidney disease.  How do carbohydrates affect me? Carbohydrates affect your blood glucose level more than any other type of food. Eating carbohydrates naturally increases the amount of glucose in your blood. Carbohydrate counting is a method for keeping track of how many carbohydrates you eat. Counting carbohydrates is important to keep your blood glucose at a healthy level, especially if you use insulin or take certain oral diabetes medicines. It is important to know how many carbohydrates you can safely have in each meal. This is different for every person. Your dietitian can help you calculate how many carbohydrates you should have at each meal and for snack. Foods that contain carbohydrates include:  Bread, cereal, rice, pasta, and crackers.  Potatoes and corn.  Peas, beans, and lentils.  Milk and yogurt.  Fruit and juice.  Desserts, such as cakes, cookies, ice cream, and candy.  How does alcohol affect me? Alcohol can cause a sudden decrease in blood glucose (hypoglycemia), especially if you use insulin or take certain oral diabetes medicines. Hypoglycemia can be a life-threatening condition. Symptoms of hypoglycemia (sleepiness, dizziness, and confusion) are similar to symptoms of having too much alcohol. If your health care provider says that alcohol is safe for you, follow these guidelines:  Limit alcohol intake to no more than 1 drink per day for nonpregnant women and 2 drinks per day for men. One drink equals 12 oz of beer, 5 oz of wine, or 1 oz of hard liquor.  Do not drink on an empty stomach.  Keep yourself hydrated with water, diet soda, or unsweetened iced tea.  Keep in mind that regular soda, juice, and other mixers may contain a lot of sugar and must be counted as carbohydrates.  What are tips for following this plan? Reading food labels  Start by checking the serving size on the label. The amount of calories, carbohydrates, fats, and other  nutrients listed on the label are based on one serving of the food. Many foods contain more than one serving per package.  Check the total grams (g) of carbohydrates in one serving. You can calculate the number of servings of carbohydrates in one serving by dividing the total carbohydrates by 15. For example, if a food has 30 g of total carbohydrates, it would be equal to 2 servings of carbohydrates.  Check the number of grams (g) of saturated and trans fats in one serving. Choose foods that have low or no amount of these fats.  Check the number of milligrams (mg) of sodium in one serving. Most people should limit total sodium intake to less than 2,300 mg per day.  Always check the nutrition information of foods labeled as "low-fat" or "nonfat". These foods may be higher in added sugar or refined carbohydrates and should be avoided.  Talk to your dietitian to identify your daily goals for nutrients listed on the label. Shopping  Avoid buying canned, premade, or  processed foods. These foods tend to be high in fat, sodium, and added sugar.  Shop around the outside edge of the grocery store. This includes fresh fruits and vegetables, bulk grains, fresh meats, and fresh dairy. Cooking  Use low-heat cooking methods, such as baking, instead of high-heat cooking methods like deep frying.  Cook using healthy oils, such as olive, canola, or sunflower oil.  Avoid cooking with butter, cream, or high-fat meats. Meal planning  Eat meals and snacks regularly, preferably at the same times every day. Avoid going long periods of time without eating.  Eat foods high in fiber, such as fresh fruits, vegetables, beans, and whole grains. Talk to your dietitian about how many servings of carbohydrates you can eat at each meal.  Eat 4-6 ounces of lean protein each day, such as lean meat, chicken, fish, eggs, or tofu. 1 ounce is equal to 1 ounce of meat, chicken, or fish, 1 egg, or 1/4 cup of tofu.  Eat some  foods each day that contain healthy fats, such as avocado, nuts, seeds, and fish. Lifestyle   Check your blood glucose regularly.  Exercise at least 30 minutes 5 or more days each week, or as told by your health care provider.  Take medicines as told by your health care provider.  Do not use any products that contain nicotine or tobacco, such as cigarettes and e-cigarettes. If you need help quitting, ask your health care provider.  Work with a Social worker or diabetes educator to identify strategies to manage stress and any emotional and social challenges. What are some questions to ask my health care provider?  Do I need to meet with a diabetes educator?  Do I need to meet with a dietitian?  What number can I call if I have questions?  When are the best times to check my blood glucose? Where to find more information:  American Diabetes Association: diabetes.org/food-and-fitness/food  Academy of Nutrition and Dietetics: PokerClues.dk  [employer] of Diabetes and Digestive and Kidney Diseases (NIH): ContactWire.be Summary  A healthy meal plan will help you control your blood glucose and maintain a healthy lifestyle.  Working with a diet and nutrition specialist (dietitian) can help you make a meal plan that is best for you.  Keep in mind that carbohydrates and alcohol have immediate effects on your blood glucose levels. It is important to count carbohydrates and to use alcohol carefully. This information is not intended to replace advice given to you by your health care provider. Make sure you discuss any questions you have with your health care provider. Document Released: 09/19/2004 Document Revised: 01/28/2016 Document Reviewed: 01/28/2016 Elsevier Interactive Patient Education  [employer].

## 2017-03-24 ENCOUNTER — Inpatient Hospital Stay (HOSPITAL_COMMUNITY)
Admission: AD | Admit: 2017-03-24 | Discharge: 2017-03-25 | Disposition: A | Discharge status: Home / Self Care | Payer: Medicaid Other | Source: Ambulatory Visit | Attending: Family Medicine | Admitting: Family Medicine

## 2017-03-24 ENCOUNTER — Encounter (HOSPITAL_COMMUNITY): Payer: Self-pay | Admitting: *Deleted

## 2017-03-24 DIAGNOSIS — R109 Unspecified abdominal pain: Secondary | ICD-10-CM | POA: Diagnosis present

## 2017-03-24 DIAGNOSIS — O99333 Smoking (tobacco) complicating pregnancy, third trimester: Secondary | ICD-10-CM | POA: Insufficient documentation

## 2017-03-24 DIAGNOSIS — Z3A35 35 weeks gestation of pregnancy: Secondary | ICD-10-CM | POA: Diagnosis not present

## 2017-03-24 DIAGNOSIS — O4703 False labor before 37 completed weeks of gestation, third trimester: Secondary | ICD-10-CM | POA: Diagnosis not present

## 2017-03-24 DIAGNOSIS — O26893 Other specified pregnancy related conditions, third trimester: Secondary | ICD-10-CM | POA: Diagnosis present

## 2017-03-24 HISTORY — DX: Type 2 diabetes mellitus without complications: E11.9

## 2017-03-24 LAB — CERVICOVAGINAL ANCILLARY ONLY
BACTERIAL VAGINITIS: NEGATIVE
CHLAMYDIA, DNA PROBE: NEGATIVE
Candida vaginitis: POSITIVE — AB
NEISSERIA GONORRHEA: NEGATIVE
TRICH (WINDOWPATH): NEGATIVE

## 2017-03-24 LAB — URINALYSIS, ROUTINE W REFLEX MICROSCOPIC
Bilirubin Urine: NEGATIVE
Glucose, UA: NEGATIVE mg/dL
Hgb urine dipstick: NEGATIVE
Ketones, ur: 5 mg/dL — AB
Leukocytes, UA: NEGATIVE
Nitrite: NEGATIVE
Protein, ur: NEGATIVE mg/dL
Specific Gravity, Urine: 1.019 (ref 1.005–1.030)
pH: 5 (ref 5.0–8.0)

## 2017-03-24 LAB — HEMOGLOBIN A1C
Est. average glucose Bld gHb Est-mCnc: 114 mg/dL
Hgb A1c MFr Bld: 5.6 % (ref 4.8–5.6)

## 2017-03-24 NOTE — MAU Note (Signed)
Pt reports contractions that started last night that were every 7-10 mins. States they got better, but today and got worse. States they are 5-6 mins apart. Reprots a lot of pressure. States she was checked yesterday and was 1cm. Pt denies LOF and vag bleeding. Pts some clear discharge. Reports good fetal movement.

## 2017-03-24 NOTE — MAU Provider Note (Signed)
Chief Complaint:  Contractions   First Provider Initiated Contact with Patient 03/24/17 2244     HPI: [name] is a 37 y.o. [id number] at 5435w5dwho presents to maternity admissions reporting uterine contractions since 7pm.  Have gotten stronger.  Had an episode last night also.  . She reports good fetal movement, denies LOF, vaginal bleeding, vaginal itching/burning, urinary symptoms, h/a, dizziness, n/v, diarrhea, constipation or fever/chills.  She denies headache, visual changes or RUQ abdominal pain.  Abdominal Pain  This is a recurrent problem. The current episode started yesterday. The onset quality is gradual. The problem occurs intermittently. The problem has been unchanged. The pain is located in the LLQ, RLQ and suprapubic region. The pain is moderate. The quality of the pain is cramping. The abdominal pain does not radiate. Pertinent negatives include no constipation, diarrhea, dysuria, fever or headaches. Nothing aggravates the pain. The pain is relieved by nothing. She has tried nothing for the symptoms.   RN Note: Pt reports contractions that started last night that were every 7-10 mins. States they got better, but today and got worse. States they are 5-6 mins apart. Reprots a lot of pressure. States she was checked yesterday and was 1cm. Pt denies LOF and vag bleeding. Pts some clear discharge. Reports good fetal movement.     Past Medical History: Past Medical History:  Diagnosis Date  . Diabetes mellitus without complication (HCC)    Gestational diet controlled    Past obstetric history: OB History  Gravida Para Term Preterm AB Living  6 3 3   2 3   SAB TAB Ectopic Multiple Live Births  1 1 0 0 3    # Outcome Date GA Lbr Len/2nd Weight Sex Delivery Anes PTL Lv  6 Current           5 SAB           4 TAB           3 Term           2 Term           1 Term               Past Surgical History: Past Surgical History:  Procedure Laterality Date  . DILATION AND  CURETTAGE OF UTERUS N/A 07/06/2015   Procedure: DILATATION AND Evacuation;  Surgeon: Tereso NewcomerUgonna A Anyanwu, MD;  Location: [facility];  Service: Gynecology;  Laterality: N/A;  . tummy tuck      Family History: Family History  Problem Relation Age of Onset  . Breast cancer Mother   . Diabetes Maternal Grandmother     Social History: Social History   Tobacco Use  . Smoking status: Current Some Day Smoker    Types: Cigarettes    Last attempt to quit: 07/05/2014    Years since quitting: 2.7  . Smokeless tobacco: Never Used  Substance Use Topics  . Alcohol use: No    Comment: occasionally  . Drug use: No    Allergies: No Known Allergies  Meds:  Medications Prior to Admission  Medication Sig Dispense Refill Last Dose  . cyclobenzaprine (FLEXERIL) 10 MG tablet Take 1 tablet (10 mg total) by mouth every 8 (eight) hours as needed for muscle spasms. 30 tablet 2 Taking  . diphenhydramine-acetaminophen (TYLENOL PM) 25-500 MG TABS tablet Take 1 tablet by mouth at bedtime as needed.   Taking  . Elastic Bandages & Supports (COMFORT FIT MATERNITY SUPP SM) MISC Wear as directed. 1 each  0 Taking  . glucose blood (ACCU-CHEK GUIDE) test strip Check blood sugars 3-4x daily, as directed. 100 each 12 Taking  . Lancets (ACCU-CHEK MULTICLIX) lancets Check blood sugars 3-4x daily, as directed. 100 each 12 Taking  . omeprazole (PRILOSEC) 20 MG capsule Take 1 capsule (20 mg total) by mouth 2 (two) times daily before a meal. 60 capsule 5 Taking  . Prenat w/o A-FeCbn-Meth-FA-DHA (PRENATE MINI) 29-0.6-0.4-350 MG CAPS Take 1 capsule by mouth daily before breakfast. (Patient not taking: Reported on 03/23/2017) 90 capsule 4 Not Taking  . Prenat-FeAsp-Meth-FA-DHA w/o A (PRENATE PIXIE) 10-0.6-0.4-200 MG CAPS TK 1 C PO QD  3 Taking  . zolpidem (AMBIEN) 5 MG tablet Take 1 tablet (5 mg total) by mouth at bedtime as needed for sleep. (Patient not taking: Reported on 01/28/2017) 30 tablet 2 Not Taking    I have reviewed  patient's Past Medical Hx, Surgical Hx, Family Hx, Social Hx, medications and allergies.   ROS:  Review of Systems  Constitutional: Negative for fever.  Gastrointestinal: Positive for abdominal pain. Negative for constipation and diarrhea.  Genitourinary: Negative for dysuria.  Neurological: Negative for headaches.   Other systems negative  Physical Exam   Patient Vitals for the past 24 hrs:  BP Temp Temp src Pulse Resp SpO2 Height Weight  03/24/17 2206 (!) 141/82 98.3 F (36.8 C) Oral (!) 103 19 99 % 5\' 4"  (1.626 m) 195 lb (88.5 kg)   Constitutional: Well-developed, well-nourished female in no acute distress.  Cardiovascular: normal rate and rhythm Respiratory: normal effort, clear to auscultation bilaterally GI: Abd soft, non-tender, gravid appropriate for gestational age.   No rebound or guarding. MS: Extremities nontender, no edema, normal ROM Neurologic: Alert and oriented x 4.  GU: Neg CVAT.  PELVIC EXAM:  Dilation: 2 Effacement (%): 80 Station: 0 Presentation: Vertex Exam by:: [email or web address], rnc   Recheck of cervix one hour later was unchanged  FHT:  Baseline 140 , moderate variability, accelerations present, no decelerations Contractions: q 3-4 mins Irregular    Labs: Results for orders placed or performed during the hospital encounter of 03/24/17 (from the past 24 hour(s))  Urinalysis, Routine w reflex microscopic     Status: Abnormal   Collection Time: 03/24/17 10:08 PM  Result Value Ref Range   Color, Urine AMBER (A) YELLOW   APPearance CLEAR CLEAR   Specific Gravity, Urine 1.019 1.005 - 1.030   pH 5.0 5.0 - 8.0   Glucose, UA NEGATIVE NEGATIVE mg/dL   Hgb urine dipstick NEGATIVE NEGATIVE   Bilirubin Urine NEGATIVE NEGATIVE   Ketones, ur 5 (A) NEGATIVE mg/dL   Protein, ur NEGATIVE NEGATIVE mg/dL   Nitrite NEGATIVE NEGATIVE   Leukocytes, UA NEGATIVE NEGATIVE   O/Positive/-- (10/11 1211)  Imaging:    MAU Course/MDM: I have ordered labs and reviewed  results. Urine is clear NST reviewed and is reactive.  Patient really thought she was in labor, so we kept her for an hour and rechecked.  Cervix was unchanged. Offered tocolysis for comfort and she declined.  Will give a Percocet for sleep then discharge home Initial BP slightly elevated (141/82), repeat was 127/73.  Treatments in MAU included EFM.    Assessment: Single IUP at [redacted]w[redacted]d Preterm uterine contractions without cervical change  Plan: Discharge home Preterm Labor precautions and fetal kick counts Follow up in Office for prenatal visits and recheck of cervix  Encouraged to return here or to other Urgent Care/ED if she develops worsening of symptoms, increase in pain, fever,  or other concerning symptoms.   Pt stable at time of discharge.  Wynelle Bourgeois CNM, MSN Certified Nurse-Midwife 03/24/2017 11:53 PM

## 2017-03-25 ENCOUNTER — Other Ambulatory Visit: Payer: Self-pay | Admitting: Certified Nurse Midwife

## 2017-03-25 DIAGNOSIS — B3731 Acute candidiasis of vulva and vagina: Secondary | ICD-10-CM

## 2017-03-25 DIAGNOSIS — O0990 Supervision of high risk pregnancy, unspecified, unspecified trimester: Secondary | ICD-10-CM

## 2017-03-25 DIAGNOSIS — B373 Candidiasis of vulva and vagina: Secondary | ICD-10-CM

## 2017-03-25 LAB — STREP GP B NAA: Strep Gp B NAA: NEGATIVE

## 2017-03-25 MED ORDER — TERCONAZOLE 0.8 % VA CREA
1.0000 | TOPICAL_CREAM | Freq: Every day | VAGINAL | 0 refills | Status: DC
Start: 2017-03-25 — End: 2017-05-18

## 2017-03-25 MED ORDER — FLUCONAZOLE 150 MG PO TABS: 150.0000 mg | ORAL_TABLET | Freq: Once | ORAL | 0 refills | Status: AC

## 2017-03-25 MED ORDER — OXYCODONE-ACETAMINOPHEN 5-325 MG PO TABS
1.0000 | ORAL_TABLET | Freq: Once | ORAL | Status: AC
  Administered 2017-03-25: 1 via ORAL
  Filled 2017-03-25: qty 1

## 2017-03-25 NOTE — Discharge Instructions (Signed)

## 2017-03-29 ENCOUNTER — Other Ambulatory Visit: Payer: Self-pay | Admitting: Obstetrics

## 2017-03-29 DIAGNOSIS — F5101 Primary insomnia: Secondary | ICD-10-CM

## 2017-03-31 ENCOUNTER — Telehealth: Payer: Self-pay

## 2017-03-31 NOTE — Telephone Encounter (Signed)
Patient called to request refill on Ambien. Request forwarded to Dr. Clearance CootsHarper.

## 2017-04-01 ENCOUNTER — Telehealth: Payer: Self-pay

## 2017-04-01 ENCOUNTER — Encounter: Payer: Medicaid Other | Admitting: Obstetrics and Gynecology

## 2017-04-01 NOTE — Telephone Encounter (Signed)
Returned call and advised pt that rx request was previously sent to provider and we

## 2017-04-02 ENCOUNTER — Other Ambulatory Visit: Payer: Self-pay | Admitting: Obstetrics

## 2017-04-02 DIAGNOSIS — F5101 Primary insomnia: Secondary | ICD-10-CM

## 2017-04-03 ENCOUNTER — Encounter: Payer: Self-pay | Admitting: Advanced Practice Midwife

## 2017-04-03 ENCOUNTER — Ambulatory Visit (INDEPENDENT_AMBULATORY_CARE_PROVIDER_SITE_OTHER): Payer: Medicaid Other | Admitting: Advanced Practice Midwife

## 2017-04-03 VITALS — BP 123/89 | HR 96 | Wt 200.0 lb

## 2017-04-03 DIAGNOSIS — O24410 Gestational diabetes mellitus in pregnancy, diet controlled: Secondary | ICD-10-CM

## 2017-04-03 DIAGNOSIS — G4701 Insomnia due to medical condition: Secondary | ICD-10-CM

## 2017-04-03 DIAGNOSIS — O0990 Supervision of high risk pregnancy, unspecified, unspecified trimester: Secondary | ICD-10-CM

## 2017-04-03 DIAGNOSIS — F5101 Primary insomnia: Secondary | ICD-10-CM

## 2017-04-03 DIAGNOSIS — Z362 Encounter for other antenatal screening follow-up: Secondary | ICD-10-CM

## 2017-04-03 DIAGNOSIS — Z3A39 39 weeks gestation of pregnancy: Secondary | ICD-10-CM

## 2017-04-03 MED ORDER — ACCU-CHEK SOFTCLIX LANCET DEV KIT: 1.0000 | PACK | Freq: Four times a day (QID) | 0 refills | Status: DC

## 2017-04-03 MED ORDER — ZOLPIDEM TARTRATE 5 MG PO TABS
5.0000 mg | ORAL_TABLET | Freq: Every evening | ORAL | 2 refills | Status: AC | PRN
Start: 2017-04-03 — End: 2027-03-02

## 2017-04-03 MED ORDER — ZOLPIDEM TARTRATE 5 MG PO TABS: 5.0000 mg | ORAL_TABLET | Freq: Every evening | ORAL | 2 refills | Status: DC | PRN

## 2017-04-03 MED ORDER — ACCU-CHEK AVIVA PLUS W/DEVICE KIT: 1.0000 | PACK | Freq: Four times a day (QID) | 0 refills | Status: DC

## 2017-04-03 MED ORDER — ACCU-CHEK SOFTCLIX LANCETS MISC: 12 refills | Status: DC

## 2017-04-03 NOTE — Progress Notes (Signed)
PRENATAL VISIT NOTE  Subjective:  [name] is a 37 y.o. [phone] at 13w1dbeing seen today for ongoing prenatal care.  She is currently monitored for the following issues for this low-risk pregnancy and has Low grade squamous intraepithelial lesion (LGSIL) on Papanicolaou smear of cervix; Supervision of high risk pregnancy, antepartum; Rubella non-immune status, antepartum; White classification A1 gestational diabetes mellitus (GDM), diet controlled; and History of abdominoplasty on their problem list.  Patient reports insomnia.  Contractions: Irregular. Vag. Bleeding: None.  Movement: Present. Denies leaking of fluid.   Not checking CBGs because she say she is scared of the lancets and they hurt.   The following portions of the patient's history were reviewed and updated as appropriate: allergies, current medications, past family history, past medical history, past social history, past surgical history and problem list. Problem list updated.  Objective:   Vitals:   04/03/17 0839  BP: 123/89  Pulse: 96  Weight: 200 lb (90.7 kg)    Fetal Status: Fetal Heart Rate (bpm): 135 Fundal Height: 36 cm Movement: Present     General:  Alert, oriented and cooperative. Patient is in no acute distress.  Skin: Skin is warm and dry. No rash noted.   Cardiovascular: Normal heart rate noted  Respiratory: Normal respiratory effort, no problems with respiration noted  Abdomen: Soft, gravid, appropriate for gestational age.  Pain/Pressure: Present     Pelvic: Cervical exam deferred        Extremities: Normal range of motion.     Mental Status:  Normal mood and affect. Normal behavior. Normal judgment and thought content.   Fasting CBG today: 99  Assessment and Plan:  Pregnancy: [id number] 322w1d1. Supervision of high risk pregnancy, antepartum  - [place] OB FOLLOW UP; Future - Blood Glucose Monitoring Suppl (ACCU-CHEK AVIVA PLUS) w/Device KIT; 1 Device by Does not apply route 4 (four)  times daily.  Dispense: 1 kit; Refill: 0 - ACCU-CHEK SOFTCLIX LANCETS lancets; Use as instructed  Dispense: 100 each; Refill: 12 - Lancets Misc. (ACCU-CHEK SOFTCLIX LANCET DEV) KIT; 1 kit by Does not apply route 4 (four) times daily.  Dispense: 1 kit; Refill: 0  2. White classification A1 gestational diabetes mellitus (GDM), diet controlled  Non-compliant w/ CBG testing. Will try softclix lancets to see if they are better tolerated by pt. Explained they we cannot know how to manage her GDM w/out that information. Lengthy discussion about importance of testing blood sugars, bringing log book and keeping appointments. Discussed risks of uncontrolled DM in pregnancy including delayed fetal lung maturity, IUFD and shoulder dystocia possibly resulting brachial plexus palsy, brain damage, intrapartum death and extensive obstetric lacerations. Patient verbalizes understanding.  - Fasting CBG - [place] OB FOLLOW UP; Future - Blood Glucose Monitoring Suppl (ACCU-CHEK AVIVA PLUS) w/Device KIT; 1 Device by Does not apply route 4 (four) times daily.  Dispense: 1 kit; Refill: 0 - ACCU-CHEK SOFTCLIX LANCETS lancets; Use as instructed  Dispense: 100 each; Refill: 12 - Lancets Misc. (ACCU-CHEK SOFTCLIX LANCET DEV) KIT; 1 kit by Does not apply route 4 (four) times daily.  Dispense: 1 kit; Refill: 0  3. [redacted] weeks gestation of pregnancy  - [place] OB FOLLOW UP; Future  4. Encounter for other antenatal screening follow-up  - [place] OB FOLLOW UP; Future  5. Primary insomnia  - zolpidem (AMBIEN) 5 MG tablet; Take 1 tablet (5 mg total) by mouth at bedtime as needed for sleep.  Dispense: 30 tablet; Refill:  2  Term labor symptoms and general obstetric precautions including but not limited to vaginal bleeding, contractions, leaking of fluid and fetal movement were reviewed in detail with the patient. Please refer to After Visit Summary for other counseling recommendations.  Return in about 1 week (around 04/10/2017) for  [place].   Manya Silvas, CNM

## 2017-04-03 NOTE — Progress Notes (Incomplete)
   PRENATAL VISIT NOTE  Subjective:  [name] is a 37 y.o. G6P3023 at [redacted]w[redacted]d being seen today for ongoing prenatal care.  She is currently monitored for the following issues for this {Blank single:19197::"high-risk","low-risk"} pregnancy and has Low grade squamous intraepithelial lesion (LGSIL) on Papanicolaou smear of cervix; Supervision of high risk pregnancy, antepartum; Rubella non-immune status, antepartum; White classification A1 gestational diabetes mellitus (GDM), diet controlled; and History of abdominoplasty on their problem list.  Patient reports {sx:14538}.  Contractions: Irregular. Vag. Bleeding: None.  Movement: Present. Denies leaking of fluid.   The following portions of the patient's history were reviewed and updated as appropriate: allergies, current medications, past family history, past medical history, past social history, past surgical history and problem list. Problem list updated.  Objective:   Vitals:   04/03/17 0839  BP: 123/89  Pulse: 96  Weight: 200 lb (90.7 kg)    Fetal Status: Fetal Heart Rate (bpm): 135 Fundal Height: 36 cm Movement: Present     General:  Alert, oriented and cooperative. Patient is in no acute distress.  Skin: Skin is warm and dry. No rash noted.   Cardiovascular: Normal heart rate noted  Respiratory: Normal respiratory effort, no problems with respiration noted  Abdomen: Soft, gravid, appropriate for gestational age.  Pain/Pressure: Present     Pelvic: {Blank single:19197::"Cervical exam performed","Cervical exam deferred"}        Extremities: Normal range of motion.     Mental Status:  Normal mood and affect. Normal behavior. Normal judgment and thought content.   Assessment and Plan:  Pregnancy: G6P3023 at [redacted]w[redacted]d  1. Supervision of high risk pregnancy, antepartum *** - [facility] OB FOLLOW UP; Future - Blood Glucose Monitoring Suppl (ACCU-CHEK AVIVA PLUS) w/Device KIT; 1 Device by Does not apply route 4 (four) times daily.   Dispense: 1 kit; Refill: 0 - ACCU-CHEK SOFTCLIX LANCETS lancets; Use as instructed  Dispense: 100 each; Refill: 12 - Lancets Misc. (ACCU-CHEK SOFTCLIX LANCET DEV) KIT; 1 kit by Does not apply route 4 (four) times daily.  Dispense: 1 kit; Refill: 0  2. White classification A1 gestational diabetes mellitus (GDM), diet controlled *** - [facility] MFM OB FOLLOW UP; Future - Blood Glucose Monitoring Suppl (ACCU-CHEK AVIVA PLUS) w/Device KIT; 1 Device by Does not apply route 4 (four) times daily.  Dispense: 1 kit; Refill: 0 - ACCU-CHEK SOFTCLIX LANCETS lancets; Use as instructed  Dispense: 100 each; Refill: 12 - Lancets Misc. (ACCU-CHEK SOFTCLIX LANCET DEV) KIT; 1 kit by Does not apply route 4 (four) times daily.  Dispense: 1 kit; Refill: 0  3. [redacted] weeks gestation of pregnancy *** - [facility] MFM OB FOLLOW UP; Future  4. Encounter for other antenatal screening follow-up *** - [facility] OB FOLLOW UP; Future  5. Primary insomnia *** - zolpidem (AMBIEN) 5 MG tablet; Take 1 tablet (5 mg total) by mouth at bedtime as needed for sleep.  Dispense: 30 tablet; Refill: 2  6. Insomnia due to medical condition *** - zolpidem (AMBIEN) 5 MG tablet; Take 1 tablet (5 mg total) by mouth at bedtime as needed for sleep.  Dispense: 30 tablet; Refill: 2  {Blank single:19197::"Term","Preterm"} labor symptoms and general obstetric precautions including but not limited to vaginal bleeding, contractions, leaking of fluid and fetal movement were reviewed in detail with the patient. Please refer to After Visit Summary for other counseling recommendations.  Return in about 1 week (around 04/10/2017) for [place].    Michaeljoseph Revolorio, CNM 

## 2017-04-03 NOTE — Patient Instructions (Signed)
Laparoscopic Tubal Ligation Laparoscopic tubal ligation is a procedure to close the fallopian tubes. This is done so that you cannot get pregnant. When the fallopian tubes are closed, the eggs that your ovaries release cannot enter the uterus, and sperm cannot reach the released eggs. A laparoscopic tubal ligation is sometimes called "getting your tubes tied." You should not have this procedure if you want to get pregnant someday or if you are unsure about having more children. Tell a health care provider about:  Any allergies you have.  All medicines you are taking, including vitamins, herbs, eye drops, creams, and over-the-counter medicines.  Any problems you or family members have had with anesthetic medicines.  Any blood disorders you have.  Any surgeries you have had.  Any medical conditions you have.  Whether you are pregnant or may be pregnant.  Any past pregnancies. What are the risks? Generally, this is a safe procedure. However, problems may occur, including:  Infection.  Bleeding.  Injury to surrounding organs.  Side effects from anesthetics.  Failure of the procedure.  This procedure can increase your risk of a kind of pregnancy in which a fertilized egg attaches to the outside of the uterus (ectopic pregnancy). What happens before the procedure?  Ask your health care provider about: ? Changing or stopping your regular medicines. This is especially important if you are taking diabetes medicines or blood thinners. ? Taking medicines such as aspirin and ibuprofen. These medicines can thin your blood. Do not take these medicines before your procedure if your health care provider instructs you not to.  Follow instructions from your health care provider about eating and drinking restrictions.  Plan to have someone take you home after the procedure.  If you go home right after the procedure, plan to have someone with you for 24 hours. What happens during the  procedure?  You will be given one or more of the following: ? A medicine to help you relax (sedative). ? A medicine to numb the area (local anesthetic). ? A medicine to make you fall asleep (general anesthetic). ? A medicine that is injected into an area of your body to numb everything below the injection site (regional anesthetic).  An IV tube will be inserted into one of your veins. It will be used to give you medicines and fluids during the procedure.  Your bladder may be emptied with a small tube (catheter).  If you have been given a general anesthetic, a tube will be put down your throat to help you breathe.  Two small cuts (incisions) will be made in your lower abdomen and near your belly button.  Your abdomen will be inflated with a gas. This will let the surgeon see better and will give the surgeon room to work.  A thin, lighted tube (laparoscope) with a camera attached will be inserted into your abdomen through one of the incisions. Small instruments will be inserted through the other incision.  The fallopian tubes will be tied off, burned (cauterized), or blocked with a clip, ring, or clamp. A small portion in the center of each fallopian tube may be removed.  The gas will be released from the abdomen.  The incisions will be closed with stitches (sutures).  A bandage (dressing) will be placed over the incisions. The procedure may vary among health care providers and hospitals. What happens after the procedure?  Your blood pressure, heart rate, breathing rate, and blood oxygen level will be monitored often until the medicines you   were given have worn off.  You will be given medicine to help with pain, nausea, and vomiting as needed. This information is not intended to replace advice given to you by your health care provider. Make sure you discuss any questions you have with your health care provider. Document Released: 03/31/2000 Document Revised: 05/31/2015 Document  Reviewed: 12/03/2014 Elsevier Interactive Patient Education  2018 [employer]  

## 2017-04-03 NOTE — Progress Notes (Deleted)
   PRENATAL VISIT NOTE  Subjective:  [name] is a 37 y.o. [phone] at 35w1dbeing seen today for ongoing prenatal care.  She is currently monitored for the following issues for this {Blank single:19197::"high-risk","low-risk"} pregnancy and has Low grade squamous intraepithelial lesion (LGSIL) on Papanicolaou smear of cervix; Supervision of high risk pregnancy, antepartum; Rubella non-immune status, antepartum; White classification A1 gestational diabetes mellitus (GDM), diet controlled; and History of abdominoplasty on their problem list.  Patient reports {sx:14538}.  Contractions: Irregular. Vag. Bleeding: None.  Movement: Present. Denies leaking of fluid.   The following portions of the patient's history were reviewed and updated as appropriate: allergies, current medications, past family history, past medical history, past social history, past surgical history and problem list. Problem list updated.  Objective:   Vitals:   04/03/17 0839  BP: 123/89  Pulse: 96  Weight: 200 lb (90.7 kg)    Fetal Status: Fetal Heart Rate (bpm): 135 Fundal Height: 36 cm Movement: Present     General:  Alert, oriented and cooperative. Patient is in no acute distress.  Skin: Skin is warm and dry. No rash noted.   Cardiovascular: Normal heart rate noted  Respiratory: Normal respiratory effort, no problems with respiration noted  Abdomen: Soft, gravid, appropriate for gestational age.  Pain/Pressure: Present     Pelvic: {Blank single:19197::"Cervical exam performed","Cervical exam deferred"}        Extremities: Normal range of motion.     Mental Status:  Normal mood and affect. Normal behavior. Normal judgment and thought content.   Assessment and Plan:  Pregnancy: [id number] 331w1d1. Supervision of high risk pregnancy, antepartum *** - [place] OB FOLLOW UP; Future - Blood Glucose Monitoring Suppl (ACCU-CHEK AVIVA PLUS) w/Device KIT; 1 Device by Does not apply route 4 (four) times daily.   Dispense: 1 kit; Refill: 0 - ACCU-CHEK SOFTCLIX LANCETS lancets; Use as instructed  Dispense: 100 each; Refill: 12 - Lancets Misc. (ACCU-CHEK SOFTCLIX LANCET DEV) KIT; 1 kit by Does not apply route 4 (four) times daily.  Dispense: 1 kit; Refill: 0  2. White classification A1 gestational diabetes mellitus (GDM), diet controlled *** - [place] OB FOLLOW UP; Future - Blood Glucose Monitoring Suppl (ACCU-CHEK AVIVA PLUS) w/Device KIT; 1 Device by Does not apply route 4 (four) times daily.  Dispense: 1 kit; Refill: 0 - ACCU-CHEK SOFTCLIX LANCETS lancets; Use as instructed  Dispense: 100 each; Refill: 12 - Lancets Misc. (ACCU-CHEK SOFTCLIX LANCET DEV) KIT; 1 kit by Does not apply route 4 (four) times daily.  Dispense: 1 kit; Refill: 0  3. [redacted] weeks gestation of pregnancy *** - [place] OB FOLLOW UP; Future  4. Encounter for other antenatal screening follow-up *** - [place] OB FOLLOW UP; Future  5. Primary insomnia *** - zolpidem (AMBIEN) 5 MG tablet; Take 1 tablet (5 mg total) by mouth at bedtime as needed for sleep.  Dispense: 30 tablet; Refill: 2  6. Insomnia due to medical condition *** - zolpidem (AMBIEN) 5 MG tablet; Take 1 tablet (5 mg total) by mouth at bedtime as needed for sleep.  Dispense: 30 tablet; Refill: 2  {Blank single:19197::"Term","Preterm"} labor symptoms and general obstetric precautions including but not limited to vaginal bleeding, contractions, leaking of fluid and fetal movement were reviewed in detail with the patient. Please refer to After Visit Summary for other counseling recommendations.  Return in about 1 week (around 04/10/2017) [place]   ViManya SilvasCNM

## 2017-04-04 ENCOUNTER — Inpatient Hospital Stay (HOSPITAL_COMMUNITY): Payer: Medicaid Other | Admitting: Anesthesiology

## 2017-04-04 ENCOUNTER — Encounter (HOSPITAL_COMMUNITY): Payer: Self-pay

## 2017-04-04 ENCOUNTER — Inpatient Hospital Stay (HOSPITAL_COMMUNITY)
Admission: AD | Admit: 2017-04-04 | Discharge: 2017-04-07 | DRG: 785 | Disposition: A | Discharge status: Home / Self Care | Payer: Medicaid Other | Source: Ambulatory Visit | Attending: Obstetrics & Gynecology | Admitting: Obstetrics & Gynecology

## 2017-04-04 ENCOUNTER — Inpatient Hospital Stay (HOSPITAL_COMMUNITY): Payer: Medicaid Other

## 2017-04-04 ENCOUNTER — Encounter (HOSPITAL_COMMUNITY)
Admission: AD | Disposition: A | Discharge status: Home / Self Care | Payer: Self-pay | Source: Ambulatory Visit | Attending: Obstetrics & Gynecology

## 2017-04-04 DIAGNOSIS — O24420 Gestational diabetes mellitus in childbirth, diet controlled: Secondary | ICD-10-CM | POA: Diagnosis present

## 2017-04-04 DIAGNOSIS — F17210 Nicotine dependence, cigarettes, uncomplicated: Secondary | ICD-10-CM | POA: Diagnosis present

## 2017-04-04 DIAGNOSIS — O99334 Smoking (tobacco) complicating childbirth: Secondary | ICD-10-CM | POA: Diagnosis present

## 2017-04-04 DIAGNOSIS — O368130 Decreased fetal movements, third trimester, not applicable or unspecified: Secondary | ICD-10-CM | POA: Diagnosis present

## 2017-04-04 DIAGNOSIS — O0990 Supervision of high risk pregnancy, unspecified, unspecified trimester: Secondary | ICD-10-CM

## 2017-04-04 DIAGNOSIS — O34211 Maternal care for low transverse scar from previous cesarean delivery: Secondary | ICD-10-CM | POA: Diagnosis present

## 2017-04-04 DIAGNOSIS — O368390 Maternal care for abnormalities of the fetal heart rate or rhythm, unspecified trimester, not applicable or unspecified: Secondary | ICD-10-CM | POA: Diagnosis present

## 2017-04-04 DIAGNOSIS — Z3A37 37 weeks gestation of pregnancy: Secondary | ICD-10-CM

## 2017-04-04 DIAGNOSIS — O09899 Supervision of other high risk pregnancies, unspecified trimester: Secondary | ICD-10-CM

## 2017-04-04 DIAGNOSIS — Z283 Underimmunization status: Secondary | ICD-10-CM

## 2017-04-04 DIAGNOSIS — O368190 Decreased fetal movements, unspecified trimester, not applicable or unspecified: Secondary | ICD-10-CM

## 2017-04-04 DIAGNOSIS — Z302 Encounter for sterilization: Secondary | ICD-10-CM

## 2017-04-04 DIAGNOSIS — O4593 Premature separation of placenta, unspecified, third trimester: Secondary | ICD-10-CM | POA: Diagnosis present

## 2017-04-04 DIAGNOSIS — Z98891 History of uterine scar from previous surgery: Secondary | ICD-10-CM

## 2017-04-04 DIAGNOSIS — O9989 Other specified diseases and conditions complicating pregnancy, childbirth and the puerperium: Secondary | ICD-10-CM

## 2017-04-04 DIAGNOSIS — O24429 Gestational diabetes mellitus in childbirth, unspecified control: Secondary | ICD-10-CM | POA: Diagnosis present

## 2017-04-04 HISTORY — DX: Other specified postprocedural states: Z98.890

## 2017-04-04 HISTORY — PX: TUBAL LIGATION: SHX77

## 2017-04-04 HISTORY — DX: Nicotine dependence, cigarettes, uncomplicated: F17.210

## 2017-04-04 LAB — CBC
HEMATOCRIT: 33.3 % — AB (ref 36.0–46.0)
Hemoglobin: 10.5 g/dL — ABNORMAL LOW (ref 12.0–15.0)
MCH: 24.4 pg — ABNORMAL LOW (ref 26.0–34.0)
MCHC: 31.5 g/dL (ref 30.0–36.0)
MCV: 77.4 fL — AB (ref 78.0–100.0)
PLATELETS: 208 10*3/uL (ref 150–400)
RBC: 4.3 MIL/uL (ref 3.87–5.11)
RDW: 17.9 % — AB (ref 11.5–15.5)
WBC: 7.7 10*3/uL (ref 4.0–10.5)

## 2017-04-04 LAB — TYPE AND SCREEN
ABO/RH(D): O POS
ANTIBODY SCREEN: NEGATIVE

## 2017-04-04 SURGERY — Surgical Case
Anesthesia: Spinal

## 2017-04-04 MED ORDER — TETANUS-DIPHTH-ACELL PERTUSSIS 5-2.5-18.5 LF-MCG/0.5 IM SUSP: 0.5000 mL | Freq: Once | INTRAMUSCULAR | Status: DC

## 2017-04-04 MED ORDER — OXYCODONE-ACETAMINOPHEN 5-325 MG PO TABS
2.0000 | ORAL_TABLET | ORAL | Status: DC | PRN
  Administered 2017-04-06 (×3): 2 via ORAL
  Filled 2017-04-04 (×4): qty 2

## 2017-04-04 MED ORDER — DIPHENHYDRAMINE HCL 25 MG PO CAPS: 25.0000 mg | ORAL_CAPSULE | ORAL | Status: DC | PRN

## 2017-04-04 MED ORDER — FENTANYL CITRATE (PF) 100 MCG/2ML IJ SOLN: 25.0000 ug | INTRAMUSCULAR | Status: DC | PRN

## 2017-04-04 MED ORDER — FENTANYL CITRATE (PF) 100 MCG/2ML IJ SOLN
INTRAMUSCULAR | Status: DC | PRN
  Administered 2017-04-04: 40 ug via INTRAVENOUS
  Administered 2017-04-04: 50 ug via INTRAVENOUS
  Administered 2017-04-04: 10 ug via INTRATHECAL

## 2017-04-04 MED ORDER — DEXAMETHASONE SODIUM PHOSPHATE 4 MG/ML IJ SOLN
INTRAMUSCULAR | Status: DC | PRN
  Administered 2017-04-04: 4 mg via INTRAVENOUS

## 2017-04-04 MED ORDER — SCOPOLAMINE 1 MG/3DAYS TD PT72
1.0000 | MEDICATED_PATCH | Freq: Once | TRANSDERMAL | Status: DC
  Filled 2017-04-04: qty 1

## 2017-04-04 MED ORDER — KETOROLAC TROMETHAMINE 30 MG/ML IJ SOLN
30.0000 mg | Freq: Four times a day (QID) | INTRAMUSCULAR | Status: AC | PRN
  Administered 2017-04-04: 30 mg via INTRAMUSCULAR

## 2017-04-04 MED ORDER — LACTATED RINGERS IV SOLN: INTRAVENOUS | Status: DC

## 2017-04-04 MED ORDER — KETOROLAC TROMETHAMINE 30 MG/ML IJ SOLN: 30.0000 mg | Freq: Four times a day (QID) | INTRAMUSCULAR | Status: AC | PRN

## 2017-04-04 MED ORDER — SODIUM CHLORIDE 0.9% FLUSH: 3.0000 mL | INTRAVENOUS | Status: DC | PRN

## 2017-04-04 MED ORDER — ZOLPIDEM TARTRATE 5 MG PO TABS: 5.0000 mg | ORAL_TABLET | Freq: Every evening | ORAL | Status: DC | PRN

## 2017-04-04 MED ORDER — ACETAMINOPHEN 500 MG PO TABS
1000.0000 mg | ORAL_TABLET | Freq: Four times a day (QID) | ORAL | Status: AC
  Administered 2017-04-04 – 2017-04-05 (×3): 1000 mg via ORAL
  Filled 2017-04-04 (×3): qty 2

## 2017-04-04 MED ORDER — SODIUM CHLORIDE 0.9 % IR SOLN
Status: DC | PRN
  Administered 2017-04-04: 1000 mL

## 2017-04-04 MED ORDER — CEFAZOLIN SODIUM-DEXTROSE 2-4 GM/100ML-% IV SOLN
INTRAVENOUS | Status: AC
  Filled 2017-04-04: qty 100

## 2017-04-04 MED ORDER — SCOPOLAMINE 1 MG/3DAYS TD PT72
MEDICATED_PATCH | TRANSDERMAL | Status: DC | PRN
  Administered 2017-04-04: 1 via TRANSDERMAL

## 2017-04-04 MED ORDER — METOCLOPRAMIDE HCL 5 MG/ML IJ SOLN: 10.0000 mg | Freq: Once | INTRAMUSCULAR | Status: DC | PRN

## 2017-04-04 MED ORDER — IBUPROFEN 600 MG PO TABS
600.0000 mg | ORAL_TABLET | Freq: Four times a day (QID) | ORAL | Status: DC
  Administered 2017-04-05 – 2017-04-07 (×10): 600 mg via ORAL
  Filled 2017-04-04 (×11): qty 1

## 2017-04-04 MED ORDER — PRENATAL MULTIVITAMIN CH
1.0000 | ORAL_TABLET | Freq: Every day | ORAL | Status: DC
  Administered 2017-04-06 – 2017-04-07 (×2): 1 via ORAL
  Filled 2017-04-04 (×2): qty 1

## 2017-04-04 MED ORDER — OXYTOCIN 10 UNIT/ML IJ SOLN
INTRAVENOUS | Status: DC | PRN
  Administered 2017-04-04: 40 [IU] via INTRAVENOUS

## 2017-04-04 MED ORDER — ACETAMINOPHEN 325 MG PO TABS: 650.0000 mg | ORAL_TABLET | ORAL | Status: DC | PRN

## 2017-04-04 MED ORDER — CYCLOBENZAPRINE HCL 10 MG PO TABS
10.0000 mg | ORAL_TABLET | Freq: Three times a day (TID) | ORAL | Status: DC | PRN
  Filled 2017-04-04: qty 1

## 2017-04-04 MED ORDER — DEXAMETHASONE SODIUM PHOSPHATE 4 MG/ML IJ SOLN
INTRAMUSCULAR | Status: AC
  Filled 2017-04-04: qty 1

## 2017-04-04 MED ORDER — LACTATED RINGERS IV SOLN
INTRAVENOUS | Status: DC | PRN
  Administered 2017-04-04: 17:00:00 via INTRAVENOUS

## 2017-04-04 MED ORDER — BUPIVACAINE IN DEXTROSE 0.75-8.25 % IT SOLN
INTRATHECAL | Status: AC
  Filled 2017-04-04: qty 2

## 2017-04-04 MED ORDER — MAGNESIUM HYDROXIDE 400 MG/5ML PO SUSP: 30.0000 mL | ORAL | Status: DC | PRN

## 2017-04-04 MED ORDER — MEASLES, MUMPS & RUBELLA VAC ~~LOC~~ INJ
0.5000 mL | INJECTION | Freq: Once | SUBCUTANEOUS | Status: DC
  Filled 2017-04-04: qty 0.5

## 2017-04-04 MED ORDER — MORPHINE SULFATE (PF) 0.5 MG/ML IJ SOLN
INTRAMUSCULAR | Status: DC | PRN
  Administered 2017-04-04: .2 mg via INTRATHECAL

## 2017-04-04 MED ORDER — NALOXONE HCL 0.4 MG/ML IJ SOLN: 0.4000 mg | INTRAMUSCULAR | Status: DC | PRN

## 2017-04-04 MED ORDER — KETOROLAC TROMETHAMINE 30 MG/ML IJ SOLN
INTRAMUSCULAR | Status: AC
  Filled 2017-04-04: qty 1

## 2017-04-04 MED ORDER — LACTATED RINGERS IV SOLN
INTRAVENOUS | Status: DC
  Administered 2017-04-04 (×2): via INTRAVENOUS

## 2017-04-04 MED ORDER — MEPERIDINE HCL 25 MG/ML IJ SOLN: 6.2500 mg | INTRAMUSCULAR | Status: DC | PRN

## 2017-04-04 MED ORDER — CEFAZOLIN SODIUM-DEXTROSE 2-3 GM-%(50ML) IV SOLR
INTRAVENOUS | Status: DC | PRN
  Administered 2017-04-04: 2 g via INTRAVENOUS

## 2017-04-04 MED ORDER — IBUPROFEN 600 MG PO TABS: 600.0000 mg | ORAL_TABLET | Freq: Four times a day (QID) | ORAL | Status: DC

## 2017-04-04 MED ORDER — FERROUS SULFATE 325 (65 FE) MG PO TABS
325.0000 mg | ORAL_TABLET | Freq: Two times a day (BID) | ORAL | Status: DC
  Administered 2017-04-05 – 2017-04-06 (×3): 325 mg via ORAL
  Filled 2017-04-04 (×3): qty 1

## 2017-04-04 MED ORDER — SOD CITRATE-CITRIC ACID 500-334 MG/5ML PO SOLN
30.0000 mL | Freq: Once | ORAL | Status: AC
  Administered 2017-04-04: 30 mL via ORAL
  Filled 2017-04-04: qty 15

## 2017-04-04 MED ORDER — WITCH HAZEL-GLYCERIN EX PADS: 1.0000 "application " | MEDICATED_PAD | CUTANEOUS | Status: DC | PRN

## 2017-04-04 MED ORDER — LACTATED RINGERS IV SOLN
INTRAVENOUS | Status: DC
  Administered 2017-04-05: 02:00:00 via INTRAVENOUS

## 2017-04-04 MED ORDER — COCONUT OIL OIL: 1.0000 "application " | TOPICAL_OIL | Status: DC | PRN

## 2017-04-04 MED ORDER — NALBUPHINE HCL 10 MG/ML IJ SOLN
5.0000 mg | INTRAMUSCULAR | Status: DC | PRN
  Administered 2017-04-05 (×2): 5 mg via INTRAVENOUS
  Filled 2017-04-04 (×3): qty 1

## 2017-04-04 MED ORDER — FAMOTIDINE IN NACL 20-0.9 MG/50ML-% IV SOLN
20.0000 mg | Freq: Once | INTRAVENOUS | Status: AC
  Administered 2017-04-04: 20 mg via INTRAVENOUS
  Filled 2017-04-04: qty 50

## 2017-04-04 MED ORDER — OXYTOCIN 10 UNIT/ML IJ SOLN
INTRAMUSCULAR | Status: AC
  Filled 2017-04-04: qty 4

## 2017-04-04 MED ORDER — SENNOSIDES-DOCUSATE SODIUM 8.6-50 MG PO TABS
2.0000 | ORAL_TABLET | ORAL | Status: DC
  Administered 2017-04-05 – 2017-04-06 (×3): 2 via ORAL
  Filled 2017-04-04 (×3): qty 2

## 2017-04-04 MED ORDER — SCOPOLAMINE 1 MG/3DAYS TD PT72
MEDICATED_PATCH | TRANSDERMAL | Status: AC
  Filled 2017-04-04: qty 1

## 2017-04-04 MED ORDER — NALBUPHINE HCL 10 MG/ML IJ SOLN: 5.0000 mg | Freq: Once | INTRAMUSCULAR | Status: AC | PRN

## 2017-04-04 MED ORDER — SIMETHICONE 80 MG PO CHEW
80.0000 mg | CHEWABLE_TABLET | ORAL | Status: DC
  Administered 2017-04-05 – 2017-04-06 (×2): 80 mg via ORAL
  Filled 2017-04-04 (×3): qty 1

## 2017-04-04 MED ORDER — ENOXAPARIN SODIUM 60 MG/0.6ML ~~LOC~~ SOLN
45.0000 mg | SUBCUTANEOUS | Status: DC
  Administered 2017-04-05 – 2017-04-07 (×3): 45 mg via SUBCUTANEOUS
  Filled 2017-04-04 (×4): qty 0.6

## 2017-04-04 MED ORDER — MENTHOL 3 MG MT LOZG: 1.0000 | LOZENGE | OROMUCOSAL | Status: DC | PRN

## 2017-04-04 MED ORDER — DIPHENHYDRAMINE HCL 25 MG PO CAPS: 25.0000 mg | ORAL_CAPSULE | Freq: Four times a day (QID) | ORAL | Status: DC | PRN

## 2017-04-04 MED ORDER — SIMETHICONE 80 MG PO CHEW
80.0000 mg | CHEWABLE_TABLET | ORAL | Status: DC | PRN
  Administered 2017-04-05: 80 mg via ORAL

## 2017-04-04 MED ORDER — BUPIVACAINE IN DEXTROSE 0.75-8.25 % IT SOLN
INTRATHECAL | Status: DC | PRN
  Administered 2017-04-04: 1.4 mL via INTRATHECAL

## 2017-04-04 MED ORDER — ONDANSETRON HCL 4 MG/2ML IJ SOLN: 4.0000 mg | Freq: Three times a day (TID) | INTRAMUSCULAR | Status: DC | PRN

## 2017-04-04 MED ORDER — FENTANYL CITRATE (PF) 100 MCG/2ML IJ SOLN
INTRAMUSCULAR | Status: AC
  Filled 2017-04-04: qty 2

## 2017-04-04 MED ORDER — NALBUPHINE HCL 10 MG/ML IJ SOLN
5.0000 mg | Freq: Once | INTRAMUSCULAR | Status: AC | PRN
  Administered 2017-04-04: 20:00:00 via INTRAVENOUS

## 2017-04-04 MED ORDER — OXYCODONE-ACETAMINOPHEN 5-325 MG PO TABS
1.0000 | ORAL_TABLET | ORAL | Status: DC | PRN
  Administered 2017-04-05 – 2017-04-07 (×6): 1 via ORAL
  Filled 2017-04-04 (×5): qty 1

## 2017-04-04 MED ORDER — DIBUCAINE 1 % RE OINT: 1.0000 "application " | TOPICAL_OINTMENT | RECTAL | Status: DC | PRN

## 2017-04-04 MED ORDER — NALOXONE HCL 4 MG/10ML IJ SOLN
1.0000 ug/kg/h | INTRAVENOUS | Status: DC | PRN
  Filled 2017-04-04: qty 5

## 2017-04-04 MED ORDER — ONDANSETRON HCL 4 MG/2ML IJ SOLN
INTRAMUSCULAR | Status: AC
  Filled 2017-04-04: qty 2

## 2017-04-04 MED ORDER — OXYTOCIN 40 UNITS IN LACTATED RINGERS INFUSION - SIMPLE MED: 2.5000 [IU]/h | INTRAVENOUS | Status: AC

## 2017-04-04 MED ORDER — PHENYLEPHRINE 8 MG IN D5W 100 ML (0.08MG/ML) PREMIX OPTIME
INJECTION | INTRAVENOUS | Status: AC
  Filled 2017-04-04: qty 100

## 2017-04-04 MED ORDER — PHENYLEPHRINE 8 MG IN D5W 100 ML (0.08MG/ML) PREMIX OPTIME
INJECTION | INTRAVENOUS | Status: DC | PRN
  Administered 2017-04-04: 60 ug/min via INTRAVENOUS

## 2017-04-04 MED ORDER — DIPHENHYDRAMINE HCL 50 MG/ML IJ SOLN: 12.5000 mg | INTRAMUSCULAR | Status: DC | PRN

## 2017-04-04 MED ORDER — NALBUPHINE HCL 10 MG/ML IJ SOLN: 5.0000 mg | INTRAMUSCULAR | Status: DC | PRN

## 2017-04-04 MED ORDER — MORPHINE SULFATE (PF) 0.5 MG/ML IJ SOLN
INTRAMUSCULAR | Status: AC
  Filled 2017-04-04: qty 10

## 2017-04-04 MED ORDER — ONDANSETRON HCL 4 MG/2ML IJ SOLN
INTRAMUSCULAR | Status: DC | PRN
  Administered 2017-04-04: 4 mg via INTRAVENOUS

## 2017-04-04 SURGICAL SUPPLY — 31 items
CHLORAPREP W/TINT 26ML (MISCELLANEOUS) ×3 IMPLANT
CLAMP CORD UMBIL (MISCELLANEOUS) IMPLANT
CLIP FILSHIE TUBAL LIGA STRL (Clip) ×3 IMPLANT
CLOTH BEACON ORANGE TIMEOUT ST (SAFETY) ×3 IMPLANT
DRESSING DISP NPWT PICO 4X12 (MISCELLANEOUS) ×3 IMPLANT
DRSG OPSITE POSTOP 4X10 (GAUZE/BANDAGES/DRESSINGS) ×3 IMPLANT
ELECT REM PT RETURN 9FT ADLT (ELECTROSURGICAL) ×3
ELECTRODE REM PT RTRN 9FT ADLT (ELECTROSURGICAL) ×2 IMPLANT
EXTRACTOR VACUUM M CUP 4 TUBE (SUCTIONS) IMPLANT
GAUZE SPONGE 4X4 12PLY STRL LF (GAUZE/BANDAGES/DRESSINGS) ×6 IMPLANT
GLOVE BIOGEL PI IND STRL 7.0 (GLOVE) ×6 IMPLANT
GLOVE BIOGEL PI INDICATOR 7.0 (GLOVE) ×3
GLOVE ECLIPSE 7.0 STRL STRAW (GLOVE) ×3 IMPLANT
GOWN STRL REUS W/TWL LRG LVL3 (GOWN DISPOSABLE) ×6 IMPLANT
KIT ABG SYR 3ML LUER SLIP (SYRINGE) IMPLANT
NEEDLE HYPO 22GX1.5 SAFETY (NEEDLE) ×3 IMPLANT
NEEDLE HYPO 25X5/8 SAFETYGLIDE (NEEDLE) ×3 IMPLANT
NS IRRIG 1000ML POUR BTL (IV SOLUTION) ×3 IMPLANT
PACK C SECTION WH (CUSTOM PROCEDURE TRAY) ×3 IMPLANT
PAD ABD 7.5X8 STRL (GAUZE/BANDAGES/DRESSINGS) ×3 IMPLANT
PAD OB MATERNITY 4.3X12.25 (PERSONAL CARE ITEMS) ×3 IMPLANT
PENCIL SMOKE EVAC W/HOLSTER (ELECTROSURGICAL) ×3 IMPLANT
RTRCTR C-SECT PINK 25CM LRG (MISCELLANEOUS) IMPLANT
SUT PDS AB 0 CTX 36 PDP370T (SUTURE) IMPLANT
SUT PLAIN 2 0 XLH (SUTURE) IMPLANT
SUT VIC AB 0 CTX 36 (SUTURE) ×2
SUT VIC AB 0 CTX36XBRD ANBCTRL (SUTURE) ×4 IMPLANT
SUT VIC AB 4-0 KS 27 (SUTURE) ×3 IMPLANT
SYR CONTROL 10ML LL (SYRINGE) ×3 IMPLANT
TOWEL OR 17X24 6PK STRL BLUE (TOWEL DISPOSABLE) ×3 IMPLANT
TRAY FOLEY BAG SILVER LF 14FR (SET/KITS/TRAYS/PACK) ×3 IMPLANT

## 2017-04-04 NOTE — Anesthesia Procedure Notes (Signed)
Spinal  Patient location during procedure: OR Staffing Anesthesiologist: Montez Hageman, MD Performed: anesthesiologist  Preanesthetic Checklist Completed: patient identified, site marked, surgical consent, pre-op evaluation, timeout performed, IV checked, risks and benefits discussed and monitors and equipment checked Spinal Block Patient position: sitting Prep: DuraPrep Patient monitoring: heart rate, continuous pulse ox and blood pressure Approach: midline Location: L4-5 Injection technique: single-shot Needle Needle type: Sprotte  Needle gauge: 24 G Needle length: 9 cm Additional Notes Expiration date of kit checked and confirmed. Patient tolerated procedure well, without complications.

## 2017-04-04 NOTE — Op Note (Addendum)
Minerva Ends PROCEDURE DATE: 04/04/2017  PREOPERATIVE DIAGNOSES: Intrauterine pregnancy at [redacted]w[redacted]d weeks gestation; decreased fetal movement; BPP 6/10; large 9 cm placental abruption on ultrasound; poorly controlled gestational diabetes; undersired fertility  POSTOPERATIVE DIAGNOSES: The same  PROCEDURE: Primary Low Transverse Cesarean Section, Bilateral Tubal Sterilization using Filshie clips  SURGEON:  Dr. Jaynie Collins  ANESTHESIOLOGIST: Dr. Phillips Grout;  CRNA: Angela Adam, CRNA  INDICATIONS: [name] is a 37 y.o. [phone] at [redacted]w[redacted]d here for cesarean section and bilateral tubal sterilization secondary to the indications listed under preoperative diagnoses; please see preoperative note for further details.  The risks of surgery were discussed with the patient including but were not limited to: bleeding which may require transfusion or reoperation; infection which may require antibiotics; injury to bowel, bladder, ureters or other surrounding organs; injury to the fetus; need for additional procedures including hysterectomy in the event of a life-threatening hemorrhage; placental abnormalities wth subsequent pregnancies, incisional problems, thromboembolic phenomenon and other postoperative/anesthesia complications.  Patient also desires permanent sterilization.  Other reversible forms of contraception were discussed with patient; she declines all other modalities. Risks of procedure discussed with patient including but not limited to: risk of regret, permanence of method, bleeding, infection, injury to surrounding organs and need for additional procedures.  Failure risk of 1-2% with increased risk of ectopic gestation if pregnancy occurs was also discussed with patient.  The patient concurred with the proposed plan, giving informed written consent for the procedures.    FINDINGS: Small amount of bloody peritoneal fluid noted upon entry into peritoneum prior to hysterotomy; no  etiology was able to be found on examination of uterus and pelvis.  Viable female infant in cephalic presentation.  Apgars 8 and 9.  Clear amniotic fluid.  Intact placenta, three vessel cord. Large 8-9 cm clot discovered beneath placenta.  Normal uterus, fallopian tubes and ovaries bilaterally. Fallopian tubes sterilized with Filshie clips bilaterally.   PICO dressing placed over incision at end of procedure.  ANESTHESIA: Spinal INTRAVENOUS FLUIDS: 1800 ml ESTIMATED BLOOD LOSS: 560 ml URINE OUTPUT:  100 ml SPECIMENS: Placenta sent to pathology COMPLICATIONS: None immediate  PROCEDURE IN DETAIL:  The patient preoperatively received intravenous antibiotics and had sequential compression devices applied to her lower extremities.  She was then taken to the operating room where spinal anesthesia was administered and was found to be adequate. She was then placed in a dorsal supine position with a leftward tilt, and prepped and draped in a sterile manner.  A foley catheter was placed into her bladder and attached to constant gravity.  After an adequate timeout was performed, a Pfannenstiel skin incision was made with scalpel and carried through to the underlying layer of fascia. The fascia was incised in the midline, and this incision was extended bilaterally using the Mayo scissors.  Kocher clamps were applied to the superior aspect of the fascial incision and the underlying rectus muscles were dissected off bluntly. A similar process was carried out on the inferior aspect of the fascial incision. The rectus muscles were separated in the midline bluntly and the peritoneum was entered bluntly. Attention was turned to the lower uterine segment where a low transverse hysterotomy was made with a scalpel and extended bilaterally bluntly.  The infant was successfully delivered, the cord was clamped and cut after one minute, and the infant was handed over to awaiting neonatology team. Uterine massage was then  administered, and the placenta delivered intact with a three-vessel cord. The uterus was then cleared  of clots and debris.  The hysterotomy was closed with 0 Vicryl in a running locked fashion, and an imbricating layer was also placed with 0 Vicryl.  Figure-of-eight 0 Vicryl serosal stitches were placed to help with hemostasis.  Attention was then turned to the fallopian tubes, and Filshie clips were placed about 3 cm from the cornua, with care given to incorporate the underlying mesosalpinx on both sides, allowing for bilateral tubal sterilization. The pelvis was cleared of all clot and debris. Hemostasis was confirmed on all surfaces.  The peritoneum was closed with a 0 Vicryl running stitch and the rectus muscles were reapproximated using 0 Vicryl interrupted stitches. The fascia was then closed using 0 Vicryl in a running fashion.  The subcutaneous layer was irrigated and reapproximated with 2-0 plain gut interrupted stitches.  The skin was closed with a 4-0 Vicryl subcuticular stitch.  After the skin was closed, a PICO disposable negative pressure wound therapy device was placed over the incision.  The suction was activated at a pressure of 80mmHg.  The adhesive was affixed well and there were no leaks noted. The patient tolerated the procedure well. Sponge, lap, instrument and needle counts were correct x 3.  She was taken to the recovery room in stable condition.    Jaynie CollinsUGONNA  Nyaira Hodgens, MD, FACOG Obstetrician & Gynecologist, [facility], [facility]

## 2017-04-04 NOTE — MAU Note (Signed)
0700 ctx became more regular and painful. Reports ctx q 4 min. No vaginal bleeding, no LOF.

## 2017-04-04 NOTE — Anesthesia Preprocedure Evaluation (Signed)
Anesthesia Evaluation  Patient identified by MRN, date of birth, ID band Patient awake    Reviewed: Allergy & Precautions, NPO status , Patient's Chart, lab work & pertinent test results  Airway Mallampati: III  TM Distance: >3 FB Neck ROM: Full    Dental no notable dental hx.    Pulmonary Current Smoker,    Pulmonary exam normal breath sounds clear to auscultation       Cardiovascular negative cardio ROS Normal cardiovascular exam Rhythm:Regular Rate:Normal     Neuro/Psych negative neurological ROS  negative psych ROS   GI/Hepatic negative GI ROS, Neg liver ROS,   Endo/Other  negative endocrine ROSdiabetes  Renal/GU negative Renal ROS  negative genitourinary   Musculoskeletal negative musculoskeletal ROS (+)   Abdominal   Peds negative pediatric ROS (+)  Hematology negative hematology ROS (+)   Anesthesia Other Findings   Reproductive/Obstetrics (+) Pregnancy abruption                             Anesthesia Physical Anesthesia Plan  ASA: II and emergent  Anesthesia Plan: Spinal   Post-op Pain Management:    Induction:   PONV Risk Score and Plan: 2 and Ondansetron and Treatment may vary due to age or medical condition  Airway Management Planned: Natural Airway  Additional Equipment:   Intra-op Plan:   Post-operative Plan:   Informed Consent: I have reviewed the patients History and Physical, chart, labs and discussed the procedure including the risks, benefits and alternatives for the proposed anesthesia with the patient or authorized representative who has indicated his/her understanding and acceptance.   Dental advisory given  Plan Discussed with: CRNA  Anesthesia Plan Comments:         Anesthesia Quick Evaluation

## 2017-04-04 NOTE — MAU Note (Signed)
Notified House Coverage, OR Circulator, Anesthesiologist of case.

## 2017-04-04 NOTE — Consult Note (Signed)
[facility] [place]  Delivery Note:  C-section       04/04/2017  4:32 PM  I was called to the operating room at the request of the patient's obstetrician (Dr. Macon LargeAnyanwu) for an urgent c-section.  PRENATAL HX:  This is a 37 y/o G5P3013 at 5737 and 2/[redacted] weeks gestation who was admitted for an urgent c-section after she was found to have a large placental abruption and a BPP was 6/10.  She was having contractions but was unruptured.  GBS status is negative with AROM at delivery.  Her pregnancy has been complicated by A1GDM.    DELIVERY:  Infant was vigorous at delivery, requiring no resuscitation other than standard warming, drying and stimulation.  APGARs 8 and 9.  Exam within normal limits.  After 5 minutes, baby left with nurse to assist parents with skin-to-skin care.   _____________________ Electronically Signed By: Maryan CharLindsey Hailie Searight, MD Neonatologist

## 2017-04-04 NOTE — Transfer of Care (Signed)
Immediate Anesthesia Transfer of Care Note  Patient: [name]  Procedure(s) Performed: CESAREAN SECTION (N/A ) BILATERAL TUBAL LIGATION (Bilateral )  Patient Location: PACU  Anesthesia Type:Spinal  Level of Consciousness: awake and alert   Airway & Oxygen Therapy: Patient Spontanous Breathing  Post-op Assessment: Report given to RN and Post -op Vital signs reviewed and stable  Post vital signs: Reviewed  Last Vitals:  Vitals Value Taken Time  BP    Temp    Pulse 82 04/04/2017  5:38 PM  Resp 12 04/04/2017  5:38 PM  SpO2 100 % 04/04/2017  5:38 PM  Vitals shown include unvalidated device data.  Last Pain:  Vitals:   04/04/17 1356  TempSrc: Oral  PainSc:          Complications: No apparent anesthesia complications

## 2017-04-04 NOTE — H&P (Addendum)
History and Physical Note   [name] is a 37 y.o. female [phone] presenting for contractions that started at 0700 this morning. Denies ROM or vag bleeding.  Decreased FM.  Patient Active Problem List   Diagnosis Date Noted  . History of abdominoplasty 03/23/2017  . White classification A1 gestational diabetes mellitus (GDM), diet controlled 02/04/2017  . Rubella non-immune status, antepartum 10/26/2016  . Supervision of high risk pregnancy, antepartum 10/16/2016  . Low grade squamous intraepithelial lesion (LGSIL) on Papanicolaou smear of cervix 12/22/2014    OB History    Gravida  5   Para  3   Term  3   Preterm      AB  1   Living  3     SAB  1   TAB  0   Ectopic  0   Multiple  0   Live Births  3          Past Medical History:  Diagnosis Date  . Diabetes mellitus without complication (HCC)    Gestational diet controlled   Past Surgical History:  Procedure Laterality Date  . DILATION AND CURETTAGE OF UTERUS N/A 07/06/2015   Procedure: DILATATION AND Evacuation;  Surgeon: Tereso Newcomer, MD;  Location: [facility];  Service: Gynecology;  Laterality: N/A;  . tummy tuck     Family History: family history includes Breast cancer in her mother; Diabetes in her maternal grandmother. Social History:  reports that she has been smoking cigarettes.  She has never used smokeless tobacco. She reports that she does not drink alcohol or use drugs.  Review of Systems  Constitutional: Negative.   HENT: Negative.   Eyes: Negative.   Respiratory: Negative.   Cardiovascular: Negative.   Gastrointestinal: Positive for abdominal pain.  Genitourinary: Negative.   Musculoskeletal: Negative.   Skin: Negative.   Neurological: Negative.   Endo/Heme/Allergies: Negative.   Psychiatric/Behavioral: Negative.    Maternal Medical History:  Reason for admission: Contractions.   Contractions: Onset was 6-12 hours ago.   Frequency: regular.   Perceived severity is  moderate.    Fetal activity: Perceived fetal activity is decreased.   Last perceived fetal movement was within the past hour.    Prenatal complications: no prenatal complications Prenatal Complications - Diabetes: gestational.    Dilation: 3 Effacement (%): 80 Station: -2 Exam by:: n druebbisch rn Blood pressure 122/87, pulse (!) 115, temperature 97.7 F (36.5 C), temperature source Oral, resp. rate 20, height 5\' 4"  (1.626 m), weight 196 lb (88.9 kg), last menstrual period 07/11/2016. Maternal Exam:  Uterine Assessment: Contraction strength is mild.  Contraction frequency is regular.   Abdomen: Patient reports no abdominal tenderness. Surgical scars: low transverse.   Fundal height is normal for dates.   Estimated fetal weight is 7lbs.   Fetal presentation: vertex  Introitus: Normal vulva. Normal vagina.  Ferning test: not done.  Nitrazine test: not done. Amniotic fluid character: not assessed.  Pelvis: adequate for delivery.   Cervix: Cervix evaluated by digital exam.     Fetal Exam Fetal Monitor Review: Mode: ultrasound.   Variability: moderate (6-25 bpm).   Pattern: accelerations present.    Fetal State Assessment: Category I - tracings are normal.     Physical Exam  Constitutional: She is oriented to person, place, and time. She appears well-developed and well-nourished.  HENT:  Head: Normocephalic.  Eyes: Pupils are equal, round, and reactive to light.  Neck: Normal range of motion.  Cardiovascular: Normal rate, regular rhythm, normal  heart sounds and intact distal pulses.  Respiratory: Breath sounds normal.  GI: Soft. Bowel sounds are normal.  Genitourinary: Vagina normal and uterus normal.  Musculoskeletal: Normal range of motion.  Neurological: She is alert and oriented to person, place, and time. She has normal reflexes.  Skin: Skin is warm and dry.  Psychiatric: She has a normal mood and affect. Her behavior is normal. Judgment and thought content  normal.    Prenatal labs: ABO, Rh: O/Positive/-- (10/11 1211) Antibody: Negative (10/11 1211) Rubella: <0.90 (10/11 1211) RPR: Non Reactive (01/23 1134)  HBsAg: Negative (10/11 1211)  HIV: Non Reactive (01/23 1134)  GBS: Negative (03/18 1327)   Assessment/Plan: BPP 4/8 and a large placental abruption Needs delivery ASAP Wants a tubal ligation GBS neg   [name], CNM 04/04/2017, 3:14 PM      Attestation of Attending Supervision of Advanced Practice Provider (PA/CNM/NP): Evaluation and management procedures were performed by the Advanced Practice Provider under my supervision and collaboration.  I have reviewed the Advanced Practice Provider's note and chart, and I agree with the management and plan.   [place] Mfm Fetal Bpp Wo Non Stress  Result Date: 04/04/2017 ----------------------------------------------------------------------  OBSTETRICS REPORT                      (Signed Final 04/04/2017 03:36 pm) ---------------------------------------------------------------------- Patient Info  ID #:       [id number]                          D.O.B.:  Oct 08, 1980 (36 yrs)  Name:       [name]               Visit Date: 04/04/2017 03:04 pm ---------------------------------------------------------------------- Performed By  Performed By:     Earley BrookeNicole S Dalrymple     Ref. Address:      [address]                    [place], [address]                                                              [address]                                                              [place], [place]                                                              [phone]  Attending:        Darlyn ReadEmily Bunce MD         Secondary Phy.:    MAU Nursing-  MAU/Triage  Referred By:      Dia Sitter                Location[facility]          [facility]                    DAWSON CNM ---------------------------------------------------------------------- Orders   #  Description                                  Code   1  [place] MFM FETAL BPP WO NON STRESS              76819.01  ----------------------------------------------------------------------   #  Ordered By               Order #        Accession #    Episode #   1  Raelyn Mora           [id number]      [id number]     [id number]  ---------------------------------------------------------------------- Indications   [redacted] weeks gestation of pregnancy                 Z3A.37   Decreased fetal movements, third trimester,     O36.8130   unspecified  ---------------------------------------------------------------------- OB History  Gravidity:    6         Term:   3         Prem:  0         SAB:   1  TOP:          1       Ectopic:  0        Living: 3 ---------------------------------------------------------------------- Fetal Evaluation  Num Of Fetuses:     1  Fetal Heart         153  Rate(bpm):  Cardiac Activity:   Observed  Presentation:       Cephalic  Placenta:           Left lateral, above cervical os  P. Cord Insertion:  Previously Visualized  Amniotic Fluid  AFI FV:      Subjectively within normal limits  AFI Sum(cm)     %Tile       Largest Pocket(cm)  18.94           73          5.93  RUQ(cm)       RLQ(cm)       LUQ(cm)        LLQ(cm)  4.08          5.93          3.51           5.42  Comment:    Moderate collection seen at superior margin of placenta              measures 8.3 x 1.7 x 9.4cm. ---------------------------------------------------------------------- Biophysical Evaluation  Amniotic F.V:   Pocket => 2 cm two          F. Tone:        Observed                  planes  F. Movement:    Not Observed                Score:          4/8  F. Breathing:   Not Observed ---------------------------------------------------------------------- Gestational Age  LMP:           38w 1d        Date:  07/11/16                 EDD:    04/17/17  Best:          37w 2d    Det. ByMarcella Dubs         EDD:    04/23/17                                      (09/09/16)  ---------------------------------------------------------------------- Impression  Single living intrauterine pregnancy at 37w 2d.  Cephalic presentation.  Left lateral placenta with heterogeneous collection at the  superior margin of placenta measuring 8.3 x 1.7 x 9.4cm.  Concerning for placental abruption.  Normal amniotic fluid volume.  BPP 4/8. ---------------------------------------------------------------------- Recommendations  Given BPP 4/8 and concern for abruption, recommend  delivery.  Preliminary results reported to Raelyn Mora, CNM.  Final report discussed with Dr. Macon Large who will proceed  with delivery. ----------------------------------------------------------------------                   Darlyn Read, MD Electronically Signed Final Report   04/04/2017 03:36 pm ----------------------------------------------------------------------   Ultrasound results reviewed with Dr. Darlyn Read (MFM).  Patient has a 8 cm x 9 cm abruption and BPP 6/10 (reactive NST here on MAU).  Given concern about this abruption and most probable fetal intolerance of labor, recommended urgent cesarean delivery.  The risks of cesarean section were discussed with the patient including but were not limited to: bleeding which may require transfusion or reoperation; infection which may require antibiotics; injury to bowel, bladder, ureters or other surrounding organs; injury to the fetus; need for additional procedures including hysterectomy in the event of a life-threatening hemorrhage; placental abnormalities wth subsequent pregnancies, incisional problems, thromboembolic phenomenon and other postoperative/anesthesia complications.  Patient also desires permanent sterilization.  Other reversible forms of contraception were discussed with patient; she declines all other modalities. Risks of procedure discussed with patient including but not limited to: risk of regret, permanence of method, bleeding, infection, injury to  surrounding organs and need for additional procedures.  Failure risk of 1-2% with increased risk of ectopic gestation if pregnancy occurs was also discussed with patient.  The patient concurred with the proposed plan, giving informed written consent for the procedures.  Anesthesia and OR aware.  Preoperative prophylactic antibiotics and SCDs ordered on call to the OR.  To OR when ready.    Jaynie Collins, MD, FACOG Obstetrician & Gynecologist, [facility] for [employer], [facility]

## 2017-04-05 ENCOUNTER — Encounter (HOSPITAL_COMMUNITY): Payer: Self-pay | Admitting: Obstetrics & Gynecology

## 2017-04-05 LAB — CBC
HEMATOCRIT: 27.2 % — AB (ref 36.0–46.0)
Hemoglobin: 8.7 g/dL — ABNORMAL LOW (ref 12.0–15.0)
MCH: 24.7 pg — ABNORMAL LOW (ref 26.0–34.0)
MCHC: 32 g/dL (ref 30.0–36.0)
MCV: 77.3 fL — AB (ref 78.0–100.0)
Platelets: 189 10*3/uL (ref 150–400)
RBC: 3.52 MIL/uL — ABNORMAL LOW (ref 3.87–5.11)
RDW: 17.6 % — AB (ref 11.5–15.5)
WBC: 8.4 10*3/uL (ref 4.0–10.5)

## 2017-04-05 LAB — RPR: RPR: NONREACTIVE

## 2017-04-05 LAB — CREATININE, SERUM
Creatinine, Ser: 0.41 mg/dL — ABNORMAL LOW (ref 0.44–1.00)
GFR calc non Af Amer: >60 mL/min (ref >60)

## 2017-04-05 NOTE — Progress Notes (Addendum)
POSTPARTUM PROGRESS NOTE  Post Partum Day 1 Subjective:  [name] is a 37 y.o. [id number] 4040w2d POD1 s/p pLTCS/BTL for BPP 4/8 and a 9cm abruption.  Patient had an episode of dizziness with ambulating yesterday but has had no dizziness since. She has has no issues with urination but has not yet passed gas or had a BM.  Denies problems with PO intake.  She denies nausea or vomiting. She currently c/o 5/10 abdominal pain not improved with Tylenol she was given this AM at 0604. Also reports diffuse itching since procedure yesterday, no rash, SOB, or throat swelling. Lochia Minimal.   Objective: Blood pressure 102/61, pulse 67, temperature 98 F (36.7 C), temperature source Oral, resp. rate 18, height 5\' 4"  (1.626 m), weight 88.9 kg (196 lb), last menstrual period 07/11/2016, SpO2 97 %, unknown if currently breastfeeding.  Physical Exam:  General: alert, cooperative and no distress Lochia:normal flow Chest: no respiratory distress Heart:regular rate, distal pulses intact Abdomen: soft, mildy tender to palpation, bowel sounds active  Uterine Fundus: firm, appropriately tender DVT Evaluation: No calf swelling or tenderness Extremities: no edema  Recent Labs    04/04/17 1533  HGB 10.5*  HCT 33.3*    Assessment/Plan:  ASSESSMENT: [name] is a 37 y.o. F6O1308G5P4014 9040w2d POD1 s/p pLTCS and BTL.   Plan for discharge tomorrow and Contraception BTL Bottle feeding   LOS: 1 day   Lehman PromHannah E SmithDO 04/05/2017, 4:14 AM    I confirm that I have verified the information documented in the medical student's note and that I have also personally reperformed the physical exam and all medical decision making activities.  Rolm BookbinderCaroline M Nawaal Alling, CNM 04/05/17 7:49 AM

## 2017-04-06 ENCOUNTER — Other Ambulatory Visit: Payer: Self-pay

## 2017-04-06 LAB — GLUCOSE, CAPILLARY
GLUCOSE-CAPILLARY: 88 mg/dL (ref 65–99)
Glucose-Capillary: 95 mg/dL (ref 65–99)

## 2017-04-06 NOTE — Progress Notes (Signed)
POSTPARTUM PROGRESS NOTE  Post Partum Day 2 Subjective:  [name] is a 37 y.o. [id number] 6532w2d s/p pLTCS/BTL.  No acute events overnight.  Pt denies problems with ambulating, voiding or po intake. She has had a BM. She denies nausea or vomiting.  Pain is moderately controlled. Currently c/o 5/10 abdominal pain that she says was improved with dose of Percocet she received this morning at 0320 but is worsening again now. Lochia Minimal.   Objective: Blood pressure 111/63, pulse 86, temperature 98 F (36.7 C), temperature source Oral, resp. rate 16, height 5\' 4"  (1.626 m), weight 88.9 kg (196 lb), last menstrual period 07/11/2016, SpO2 98 %, unknown if currently breastfeeding.  Physical Exam:  General: alert, cooperative and no distress Lochia:normal flow Chest: no respiratory distress Heart:regular rate, distal pulses intact Abdomen: soft, nontender, incision with dressing in place and some serosanguinous drainage noted Uterine Fundus: firm, appropriately tender DVT Evaluation: No calf swelling or tenderness Extremities: no edema  Recent Labs    04/04/17 1533 04/05/17 0545  HGB 10.5* 8.7*  HCT 33.3* 27.2*    Assessment/Plan:  ASSESSMENT: [name] is a 37 y.o. [id number] 5332w2d s/p pLTCS/BTL.   Plan for discharge tomorrow, Circumcision prior to discharge and Contraception BTL   LOS: 2 days   Felicie MornHannah E Kiyona Mcnall, Medical Student 04/06/2017, 7:18 AM

## 2017-04-06 NOTE — Anesthesia Postprocedure Evaluation (Signed)
Anesthesia Post Note  Patient: [name]  Procedure(s) Performed: CESAREAN SECTION (N/A ) BILATERAL TUBAL LIGATION (Bilateral )     Patient location during evaluation: PACU Anesthesia Type: Spinal Level of consciousness: awake and alert Pain management: pain level controlled Vital Signs Assessment: post-procedure vital signs reviewed and stable Respiratory status: spontaneous breathing and respiratory function stable Cardiovascular status: blood pressure returned to baseline and stable Postop Assessment: no headache, no backache, spinal receding and no apparent nausea or vomiting Anesthetic complications: no    Last Vitals:  Vitals:   04/05/17 1756 04/06/17 0533  BP: 108/74 111/63  Pulse: 74 86  Resp: 17 16  Temp: 36.9 C 36.7 C  SpO2:  98%    Last Pain:  Vitals:   04/06/17 0930  TempSrc:   PainSc: 5                  Phillips Groutarignan, Rojelio Uhrich

## 2017-04-06 NOTE — Progress Notes (Signed)
CSW received consult due to score of 15 on the Edinburgh Depression Screen.   CSW met with MOB in her first floor room/130 to offer support and complete assessment.  MOB appeared sleepy, but was pleasant and welcoming of CSW's visit.  MOB reports that delivery was a stat c-section and describes the experience as scary.  She states this is her fourth baby, but first section.  She states she is feeling well, but exhausted.  She states she thinks she was experiencing some mild depression throughout this pregnancy and attributes symptoms to the loss of her mother 3 years ago.  She states that her mom "died in my arms."  She acknowledges that this is her first baby without her mother here.  CSW validated her feelings and talked about how a major life transition can bring up feelings of grief and heightened emotion.  MOB states she has never received counseling regarding the loss of her mother and is open to resources.  She states she has a good support system, and overall feels happy about another baby.   CSW provided education regarding Baby Blues vs PMADs and provided MOB with information about support groups held at [facility].  CSW encouraged MOB to evaluate her mental health throughout the postpartum period with the use of the New Mom Checklist developed by Postpartum Progress, as well as the Edinburgh Postnatal Depression Scale and notify a medical professional if symptoms arise.  Resources for outpatient counseling, as well as specific grief counseling at [facility] given to MOB.  MOB seemed appreciative and states no further needs, questions or concerns at this time.  CSW identifies no barriers to discharge.  MOB states she has all needed supplies for infant and is aware of SIDS precautions as reviewed by CSW. 

## 2017-04-07 LAB — GLUCOSE, CAPILLARY: GLUCOSE-CAPILLARY: 81 mg/dL (ref 65–99)

## 2017-04-07 MED ORDER — OXYCODONE-ACETAMINOPHEN 5-325 MG PO TABS: 1.0000 | ORAL_TABLET | Freq: Four times a day (QID) | ORAL | 0 refills | Status: DC | PRN

## 2017-04-07 MED ORDER — IBUPROFEN 600 MG PO TABS: 600.0000 mg | ORAL_TABLET | Freq: Four times a day (QID) | ORAL | 0 refills | Status: DC

## 2017-04-07 NOTE — Discharge Instructions (Signed)

## 2017-04-07 NOTE — Discharge Summary (Signed)
OB Discharge Summary     Patient Name: [name] DOB: 17-Mar-1980 MRN: [id number]  Date of admission: 04/04/2017 Delivering MD: Verita Schneiders A   Date of discharge: 04/07/2017  Admitting diagnosis: 23WKS,CTX Intrauterine pregnancy: [redacted]w[redacted]d    Secondary diagnosis:  Principal Problem:   Placental abruption in third trimester Active Problems:   Supervision of high risk pregnancy, antepartum   Rubella non-immune status, antepartum   Gestational diabetes mellitus in childbirth, uncontrolled   BPP 6/10, antepartum   S/P urgent cesarean section and BTL  Additional problems: none     Discharge diagnosis: Term Pregnancy Delivered and GDM A1                                                                                                Post partum procedures:none  Augmentation: none  Complications: Placental Abruption  Hospital course:  Onset of Labor With Unplanned C/S  37y.o. yo [id number] admitted on 04/04/2017 for emergent C/S after she presented with contractions and decreased fetal movement, and found to have a placental abruption. The patient went for cesarean section due to placental abruption, BPP 6/10, and delivered a Viable infant,04/04/2017  Details of operation can be found in separate operative note. Patient had an uncomplicated postpartum course.  She is ambulating,tolerating a regular diet, passing flatus, and urinating well.  Patient is discharged home in stable condition 04/07/17.  Physical exam  Vitals:   04/05/17 1756 04/06/17 0533 04/06/17 1807 04/07/17 0531  BP: 108/74 111/63 134/75 115/74  Pulse: 74 86 81 83  Resp: _0 Temp: 98.4 F (36.9 C) 98 F (36.7 C) 97.6 F (36.4 C) 98.1 F (36.7 C)  TempSrc: Oral Oral Oral Oral  SpO2:  98%    Weight:      Height:       General: alert, cooperative and no distress Lochia: appropriate Uterine Fundus: firm Incision: with PICO dressing in place w/o saturation DVT Evaluation: No evidence  of DVT seen on physical exam. Labs: Lab Results  Component Value Date   WBC 8.4 04/05/2017   HGB 8.7 (L) 04/05/2017   HCT 27.2 (L) 04/05/2017   MCV 77.3 (L) 04/05/2017   PLT 189 04/05/2017   CMP Latest Ref Rng & Units 04/05/2017  Glucose 65 - 99 mg/dL -  BUN 6 - 20 mg/dL -  Creatinine 0.44 - 1.00 mg/dL 0.41(L)  Sodium 135 - 145 mmol/L -  Potassium 3.5 - 5.1 mmol/L -  Chloride 101 - 111 mmol/L -  CO2 22 - 32 mmol/L -  Calcium 8.9 - 10.3 mg/dL -  Total Protein 6.5 - 8.1 g/dL -  Total Bilirubin 0.3 - 1.2 mg/dL -  Alkaline Phos 38 - 126 U/L -  AST 15 - 41 U/L -  ALT 14 - 54 U/L -    Discharge instruction: per After Visit Summary and "Baby and Me Booklet".  After visit meds:  Allergies as of 04/07/2017   No Known Allergies     Medication List    STOP taking these medications   ACCU-CHEK AVIVA  PLUS w/Device Kit   ACCU-CHEK SOFTCLIX LANCET DEV Kit   ACCU-CHEK SOFTCLIX LANCETS lancets   glucose blood test strip Commonly known as:  ACCU-CHEK GUIDE     TAKE these medications   COMFORT FIT MATERNITY SUPP SM Misc Wear as directed.   cyclobenzaprine 10 MG tablet Commonly known as:  FLEXERIL Take 1 tablet (10 mg total) by mouth every 8 (eight) hours as needed for muscle spasms.   diphenhydramine-acetaminophen 25-500 MG Tabs tablet Commonly known as:  TYLENOL PM Take 1 tablet by mouth at bedtime as needed (pain, sleep).   ibuprofen 600 MG tablet Commonly known as:  ADVIL,MOTRIN Take 1 tablet (600 mg total) by mouth every 6 (six) hours.   omeprazole 20 MG capsule Commonly known as:  PRILOSEC Take 1 capsule (20 mg total) by mouth 2 (two) times daily before a meal.   oxyCODONE-acetaminophen 5-325 MG tablet Commonly known as:  PERCOCET/ROXICET Take 1 tablet by mouth every 6 (six) hours as needed for severe pain.   PRENATE MINI 29-0.6-0.4-350 MG Caps Take 1 capsule by mouth daily before breakfast.   terconazole 0.8 % vaginal cream Commonly known as:  TERAZOL  3 Place 1 applicator vaginally at bedtime.   zolpidem 5 MG tablet Commonly known as:  AMBIEN Take 1 tablet (5 mg total) by mouth at bedtime as needed for sleep.       Diet: routine diet  Activity: Advance as tolerated. Pelvic rest for 6 weeks.   Outpatient follow up: in 3 days for PICO dressing removal; then in 6 weeks Follow up Appt: Future Appointments  Date Time Provider [place]  04/10/2017  8:00 AM [place] None  04/16/2017  9:15 AM [facility] [place] 4 [facility]  05/18/2017  8:15 AM [facility] None  05/18/2017  8:30 AM Constant, Vickii Chafe, MD [facility] None    Postpartum contraception: s/p BTL  Newborn Data: Live born female  Birth Weight: 5 lb 14 oz (2665 g) APGAR: 8, 9  Newborn Delivery   Birth date/time:  04/04/2017 16:36:00 Delivery type:  C-Section, Low Transverse C-section categorization:  Primary     Baby Feeding: Bottle and Breast Disposition:home with mother   04/07/2017 Gailen Shelter, MD

## 2017-04-10 ENCOUNTER — Encounter: Payer: Medicaid Other | Admitting: Obstetrics

## 2017-04-10 ENCOUNTER — Ambulatory Visit (INDEPENDENT_AMBULATORY_CARE_PROVIDER_SITE_OTHER): Payer: Medicaid Other | Admitting: Obstetrics and Gynecology

## 2017-04-10 ENCOUNTER — Encounter: Payer: Self-pay | Admitting: Obstetrics and Gynecology

## 2017-04-10 VITALS — BP 126/85 | HR 71 | Temp 96.8°F | Resp 16 | Wt 192.2 lb

## 2017-04-10 DIAGNOSIS — Z4889 Encounter for other specified surgical aftercare: Secondary | ICD-10-CM

## 2017-04-10 DIAGNOSIS — G8918 Other acute postprocedural pain: Secondary | ICD-10-CM

## 2017-04-10 MED ORDER — TRAMADOL HCL 50 MG PO TABS: 50.0000 mg | ORAL_TABLET | Freq: Four times a day (QID) | ORAL | 0 refills | Status: DC | PRN

## 2017-04-10 NOTE — Progress Notes (Signed)
Ms. Minerva EndsMarseddez E Montel is a [id number] s/p urgent C/S for placental abruption on 04/04/17. She is here today for an incision check and negative pressure wound vac (NPWV) removal. NPWV removed without difficulty. Patient tolerated well. Incision C/D most of incision intact expect for 1.5 cm area in the midline portion and pinpoint area on the RT edge of incisional line. No erythema, drainage or s/x of infection noted in any area along the incisional line. Benzoin applied to incision edges, 4 steri-strips placed over midline of incision and on RT edge where skin was not well approximated. Instructed to remove steri-strips in 2 wks. Advised not to drive until 2 wks after surgery, patient's daughter on the way to pick her and the baby up from office appt. She reports ibuprofen giving minimal pain relief, but she "needs something stronger at nighttime". She reports being unable to tolerate the Percocet. She states "it causes severe N/V; unable to keep it down." Rx for Ultram 50 mg every 6 hrs prn severe pain sent to pharmacy on file. RTO in 5 wks for regular PP visit. Patient verbalized an understanding of the plan of care and agrees.   Raelyn MoraRolitta Xena Propst, CNM  04/10/2017 9:40 AM

## 2017-04-16 ENCOUNTER — Ambulatory Visit (HOSPITAL_COMMUNITY): Payer: Medicaid Other

## 2017-04-21 ENCOUNTER — Other Ambulatory Visit: Payer: Self-pay | Admitting: Obstetrics

## 2017-04-21 ENCOUNTER — Telehealth: Payer: Self-pay

## 2017-04-21 DIAGNOSIS — M545 Low back pain, unspecified: Secondary | ICD-10-CM

## 2017-04-21 MED ORDER — IBUPROFEN 800 MG PO TABS: 800.0000 mg | ORAL_TABLET | Freq: Three times a day (TID) | ORAL | 5 refills | Status: DC | PRN

## 2017-04-21 MED ORDER — CYCLOBENZAPRINE HCL 10 MG PO TABS: 10.0000 mg | ORAL_TABLET | Freq: Three times a day (TID) | ORAL | 1 refills | Status: DC | PRN

## 2017-04-21 NOTE — Telephone Encounter (Signed)
Flexeril and Ibuprofen Rx for backache. I do not treat anxiety.  She needs to see PCP.

## 2017-04-21 NOTE — Telephone Encounter (Signed)
Pt called requesting rx for lower back and abdominal pain. She is also requesting rx for anxiety.

## 2017-05-11 ENCOUNTER — Telehealth: Payer: Self-pay

## 2017-05-11 NOTE — Telephone Encounter (Signed)
Returned call and pt states that she wants anxiety med b/c she is having a hard time after having baby.....advised pt that scheduler would call her back with an earlier appt to see provider to discuss this. Pt agreed and denied any suicidal thoughts.

## 2017-05-18 ENCOUNTER — Other Ambulatory Visit: Payer: Medicaid Other

## 2017-05-18 ENCOUNTER — Encounter: Payer: Self-pay | Admitting: Obstetrics and Gynecology

## 2017-05-18 ENCOUNTER — Ambulatory Visit (INDEPENDENT_AMBULATORY_CARE_PROVIDER_SITE_OTHER): Payer: Medicaid Other | Admitting: Obstetrics and Gynecology

## 2017-05-18 VITALS — BP 120/83 | HR 73 | Ht 64.0 in | Wt 180.7 lb

## 2017-05-18 DIAGNOSIS — F329 Major depressive disorder, single episode, unspecified: Secondary | ICD-10-CM

## 2017-05-18 DIAGNOSIS — Z1389 Encounter for screening for other disorder: Secondary | ICD-10-CM

## 2017-05-18 DIAGNOSIS — F32A Depression, unspecified: Secondary | ICD-10-CM

## 2017-05-18 MED ORDER — BUPROPION HCL ER (XL) 150 MG PO TB24: 150.0000 mg | ORAL_TABLET | Freq: Every day | ORAL | 3 refills | Status: DC

## 2017-05-18 NOTE — Addendum Note (Signed)
Addended byFrutoso Chase on: 05/18/2017 10:13 AM   Modules accepted: Orders

## 2017-05-18 NOTE — Progress Notes (Deleted)
Subjective:     [name] is a 37 y.o. female who presents for a postpartum visit. She is 6 weeks postpartum following a low cervical transverse Cesarean section. I have fully reviewed the prenatal and intrapartum course. The delivery was at 37 gestational weeks due to non reassuring fetal status and placental abruption. Outcome: primary cesarean section, low transverse incision. Anesthesia: spinal. Postpartum course has been uncomplicated. Baby's course has been ***. Baby is feeding by {breast/bottle:69}. Bleeding {vag bleed:12292}. Bowel function is {normal:32111}. Bladder function is {normal:32111}. Patient {is/is not:9024} sexually active. Contraception method is tubal ligation. Postpartum depression screening: {neg default:13464::"negative"}.     Review of Systems Pertinent items are noted in HPI.   Objective:    BP 120/83   Pulse 73   Ht  (1.626 m)   Wt 180 lb 11.2 oz (82 kg)   LMP 07/11/2016   BMI 31.02 kg/m   General:  {gen appearance:16600}   Breasts:  {breast exam:1202::"inspection negative, no nipple discharge or bleeding, no masses or nodularity palpable"}  Lungs: {lung exam:16931}  Heart:  {heart exam:5510}  Abdomen: {abdomen exam:16834}   Vulva:  {labia exam:12198}  Vagina: {vagina exam:12200}  Cervix:  {cervix exam:14595}  Corpus: {uterus exam:12215}  Adnexa:  {adnexa exam:12223}  Rectal Exam: {rectal/vaginal exam:12274}        Assessment:     Normal postpartum exam. Pap smear not done at today's visit.   Plan:    1. Contraception: tubal ligation 2. Patient is medically cleared to resume all activities of daily living 3. Follow up in: {1-10:13787} {time; units:19136} or as needed.

## 2017-05-18 NOTE — Progress Notes (Signed)
..  Post Partum Exam  [name] is a 37 y.o. [id number] female who presents for a postpartum visit. She is 7 week postpartum following a low cervical transverse Cesarean section. I have fully reviewed the prenatal and intrapartum course. The delivery was at [redacted]w[redacted]d gestational weeks due to non reassuring fetal status in the setting of placental abruption.  Anesthesia: spinal. Postpartum course has been unremarkable. Baby's course has been unremarkable. Baby is feeding by bottle - Enfamil AR. Bleeding pink. Bowel function is normal. Bladder function is normal. Patient is sexually active. Contraception method is tubal ligation. Postpartum depression screening:pos. Patient admits to feeling overwhelmed at times as there is a 14 year gap between her children. She reports receiving ample support from her children and her partner. She denies suicidal/homicidal ideations. She reports a history of anxiety/depression 2 years ago and is requesting medication to help her.    Last pap smear done 10/16/2016 and was Abnormal- ASCUS - HPV  Review of Systems Pertinent items are noted in HPI.    Objective:  Blood pressure 120/83, pulse 73, height  (1.626 m), weight 180 lb 11.2 oz (82 kg), last menstrual period 07/11/2016, not currently breastfeeding.  General:  alert, cooperative and no distress   Breasts:  inspection negative, no nipple discharge or bleeding, no masses or nodularity palpable  Lungs: clear to auscultation bilaterally  Heart:  regular rate and rhythm  Abdomen: soft, non-tender; bowel sounds normal; no masses,  no organomegaly and incision: healed completely- no erythema, induration or drainage   Vulva:  normal  Vagina: normal vagina, no discharge, exudate, lesion, or erythema  Cervix:  multiparous appearance  Corpus: normal size, contour, position, consistency, mobility, non-tender  Adnexa:  normal adnexa and no mass, fullness, tenderness  Rectal Exam: Not performed.        Assessment:     Normal postpartum exam. Pap smear not done at today's visit.   Plan:   1. Contraception: tubal ligation 2. Patient is medically cleared to resume all activities of daily living. 3. Patient referred to see Behavioral health specialist. Rx Wellbutrin provided 4. Follow up in: 5 months for annual exam with pap smear or as needed.  5. 2 hour glucola today given GDM

## 2017-05-20 ENCOUNTER — Telehealth: Payer: Self-pay

## 2017-05-20 ENCOUNTER — Other Ambulatory Visit: Payer: Self-pay | Admitting: Obstetrics and Gynecology

## 2017-05-20 MED ORDER — SERTRALINE HCL 50 MG PO TABS: 50.0000 mg | ORAL_TABLET | Freq: Every day | ORAL | 3 refills | Status: DC

## 2017-05-20 NOTE — Telephone Encounter (Signed)
Pt called stating that she does not think that the Wellbutrin is not a good fit for her. She states that this did not work for her before, and would like to now if there is something else that can be called in for her.

## 2017-05-21 LAB — GLUCOSE TOLERANCE, 2 HOURS
GLUCOSE, 2 HOUR: 60 mg/dL — AB (ref 65–139)
Glucose, GTT - Fasting: 89 mg/dL (ref 65–99)

## 2017-05-21 NOTE — BH Specialist Note (Deleted)
Integrated Behavioral Health Initial Visit  MRN: [id number] Name: [name]  Number of Integrated Behavioral Health Clinician visits:: 1/6 Session Start time: ***  Session End time: *** Total time: {IBH Total Time:21014050}  Type of Service: Integrated Behavioral Health- Individual/Family Interpretor:No. Interpretor Name and Language: n/a    Warm Hand Off Completed.       SUBJECTIVE: Karen HACKWORTH is a 37 y.o. female accompanied by {CHL AMB ACCOMPANIED WU:9811914782} Patient was referred by Dr Macon Large for depression ***. Patient reports the following symptoms/concerns: *** Duration of problem: ***; Severity of problem: {Mild/Moderate/Severe:20260}  OBJECTIVE: Mood: {BHH MOOD:22306} and Affect: {BHH AFFECT:22307} Risk of harm to self or others: {CHL AMB BH Suicide Current Mental Status:21022748}  LIFE CONTEXT: Family and Social: *** School/Work: *** Self-Care: *** Life Changes: ***  GOALS ADDRESSED: Patient will: 1. Reduce symptoms of: {IBH Symptoms:21014056} 2. Increase knowledge and/or ability of: {IBH Patient Tools:21014057}  3. Demonstrate ability to: {IBH Goals:21014053}  INTERVENTIONS: Interventions utilized: {IBH Interventions:21014054}  Standardized Assessments completed: {IBH Screening Tools:21014051}  ASSESSMENT: Patient currently experiencing ***.   Patient may benefit from ***.  PLAN: 1. Follow up with behavioral health clinician on : *** 2. Behavioral recommendations: *** 3. Referral(s): {IBH Referrals:21014055} 4. "From scale of 1-10, how likely are you to follow plan?": ***  Jamie C McMannes, LCSW  No flowsheet data found.  No flowsheet data found.   Edinburgh Postnatal Depression Scale - 05/18/17 0829      Edinburgh Postnatal Depression Scale:  In the Past 7 Days   I have been able to laugh and see the funny side of things.  1    I have looked forward with enjoyment to things.  0    I have blamed myself unnecessarily when  things went wrong.  2    I have been anxious or worried for no good reason.  3    I have felt scared or panicky for no good reason.  2    Things have been getting on top of me.  1    I have been so unhappy that I have had difficulty sleeping.  2    I have felt sad or miserable.  0    I have been so unhappy that I have been crying.  1    The thought of harming myself has occurred to me.  0    Edinburgh Postnatal Depression Scale Total  12

## 2017-05-21 NOTE — Telephone Encounter (Signed)
Pt informed

## 2017-05-25 ENCOUNTER — Institutional Professional Consult (permissible substitution): Payer: Medicaid Other

## 2017-06-10 ENCOUNTER — Telehealth: Payer: Self-pay | Admitting: *Deleted

## 2017-06-10 NOTE — Telephone Encounter (Signed)
Pt called to office stating that she doesn't think the medication for depression is working and she is now having problems with Anxiety. Pt states she panics for no reason.  Pt states she is out of town a lot and would like Rx called in.  Pt state she has taken Alprazolam in the past and would like this again.    Message to be sent to provider for approval.

## 2017-06-11 ENCOUNTER — Other Ambulatory Visit: Payer: Self-pay | Admitting: Obstetrics and Gynecology

## 2017-06-11 MED ORDER — ALPRAZOLAM 0.25 MG PO TABS: 0.2500 mg | ORAL_TABLET | Freq: Every evening | ORAL | 0 refills | Status: AC | PRN

## 2017-06-11 NOTE — Telephone Encounter (Signed)
Attempt to contact pt. No answer, no VM. 

## 2017-06-11 NOTE — Telephone Encounter (Signed)
Pt made aware of Rx and need for PCP.

## 2017-07-08 ENCOUNTER — Other Ambulatory Visit: Payer: Self-pay | Admitting: Obstetrics and Gynecology

## 2017-10-05 ENCOUNTER — Encounter (HOSPITAL_COMMUNITY): Payer: Self-pay

## 2018-02-28 DIAGNOSIS — F17210 Nicotine dependence, cigarettes, uncomplicated: Secondary | ICD-10-CM | POA: Insufficient documentation

## 2018-02-28 DIAGNOSIS — R05 Cough: Secondary | ICD-10-CM | POA: Diagnosis present

## 2018-02-28 DIAGNOSIS — Z79899 Other long term (current) drug therapy: Secondary | ICD-10-CM | POA: Diagnosis not present

## 2018-02-28 DIAGNOSIS — J111 Influenza due to unidentified influenza virus with other respiratory manifestations: Secondary | ICD-10-CM | POA: Diagnosis not present

## 2018-03-01 ENCOUNTER — Emergency Department (HOSPITAL_COMMUNITY)
Admission: EM | Admit: 2018-03-01 | Discharge: 2018-03-01 | Disposition: A | Discharge status: Home / Self Care | Payer: Medicaid Other | Attending: Emergency Medicine | Admitting: Emergency Medicine

## 2018-03-01 ENCOUNTER — Other Ambulatory Visit: Payer: Self-pay

## 2018-03-01 ENCOUNTER — Encounter (HOSPITAL_COMMUNITY): Payer: Self-pay | Admitting: Emergency Medicine

## 2018-03-01 DIAGNOSIS — R69 Illness, unspecified: Secondary | ICD-10-CM

## 2018-03-01 DIAGNOSIS — J111 Influenza due to unidentified influenza virus with other respiratory manifestations: Secondary | ICD-10-CM

## 2018-03-01 HISTORY — DX: Other psychoactive substance use, unspecified, uncomplicated: F19.90

## 2018-03-01 LAB — URINALYSIS, ROUTINE W REFLEX MICROSCOPIC
Bacteria, UA: NONE SEEN
GLUCOSE, UA: NEGATIVE mg/dL
Hgb urine dipstick: NEGATIVE
Ketones, ur: 20 mg/dL — AB
Leukocytes,Ua: NEGATIVE
Nitrite: NEGATIVE
PROTEIN: 30 mg/dL — AB
Specific Gravity, Urine: 1.043 — ABNORMAL HIGH (ref 1.005–1.030)
pH: 5 (ref 5.0–8.0)

## 2018-03-01 LAB — I-STAT BETA HCG BLOOD, ED (MC, WL, AP ONLY): I-stat hCG, quantitative: <5 m[IU]/mL (ref <5)

## 2018-03-01 LAB — CBC
HCT: 41.1 % (ref 36.0–46.0)
Hemoglobin: 12.6 g/dL (ref 12.0–15.0)
MCH: 24.5 pg — ABNORMAL LOW (ref 26.0–34.0)
MCHC: 30.7 g/dL (ref 30.0–36.0)
MCV: 79.8 fL — ABNORMAL LOW (ref 80.0–100.0)
Platelets: 307 10*3/uL (ref 150–400)
RBC: 5.15 MIL/uL — ABNORMAL HIGH (ref 3.87–5.11)
RDW: 15.7 % — ABNORMAL HIGH (ref 11.5–15.5)
WBC: 5.1 10*3/uL (ref 4.0–10.5)
nRBC: 0 % (ref 0.0–0.2)

## 2018-03-01 LAB — RAPID URINE DRUG SCREEN, HOSP PERFORMED
Amphetamines: NOT DETECTED
Barbiturates: NOT DETECTED
Benzodiazepines: POSITIVE — AB
Cocaine: NOT DETECTED
OPIATES: POSITIVE — AB
Tetrahydrocannabinol: NOT DETECTED

## 2018-03-01 LAB — COMPREHENSIVE METABOLIC PANEL
ALT: 15 U/L (ref 0–44)
AST: 21 U/L (ref 15–41)
Albumin: 4.4 g/dL (ref 3.5–5.0)
Alkaline Phosphatase: 71 U/L (ref 38–126)
Anion gap: 13 (ref 5–15)
BILIRUBIN TOTAL: 0.5 mg/dL (ref 0.3–1.2)
BUN: 6 mg/dL (ref 6–20)
CO2: 15 mmol/L — ABNORMAL LOW (ref 22–32)
Calcium: 9.1 mg/dL (ref 8.9–10.3)
Chloride: 109 mmol/L (ref 98–111)
Creatinine, Ser: 0.68 mg/dL (ref 0.44–1.00)
GFR calc Af Amer: >60 mL/min (ref >60)
GFR calc non Af Amer: >60 mL/min (ref >60)
Glucose, Bld: 132 mg/dL — ABNORMAL HIGH (ref 70–99)
Potassium: 3 mmol/L — ABNORMAL LOW (ref 3.5–5.1)
Sodium: 137 mmol/L (ref 135–145)
Total Protein: 8.1 g/dL (ref 6.5–8.1)

## 2018-03-01 LAB — LIPASE, BLOOD: Lipase: 34 U/L (ref 11–51)

## 2018-03-01 LAB — CK: Total CK: 43 U/L (ref 38–234)

## 2018-03-01 MED ORDER — DICYCLOMINE HCL 20 MG PO TABS: 20.0000 mg | ORAL_TABLET | Freq: Three times a day (TID) | ORAL | 0 refills | Status: AC | PRN

## 2018-03-01 MED ORDER — SODIUM CHLORIDE 0.9 % IV BOLUS (SEPSIS)
1000.0000 mL | Freq: Once | INTRAVENOUS | Status: AC
  Administered 2018-03-01: 1000 mL via INTRAVENOUS

## 2018-03-01 MED ORDER — SODIUM CHLORIDE 0.9% FLUSH
3.0000 mL | Freq: Once | INTRAVENOUS | Status: AC
  Administered 2018-03-01: 3 mL via INTRAVENOUS

## 2018-03-01 MED ORDER — ONDANSETRON 4 MG PO TBDP: 4.0000 mg | ORAL_TABLET | Freq: Four times a day (QID) | ORAL | 0 refills | Status: DC | PRN

## 2018-03-01 MED ORDER — KETOROLAC TROMETHAMINE 30 MG/ML IJ SOLN
30.0000 mg | Freq: Once | INTRAMUSCULAR | Status: AC
  Administered 2018-03-01: 30 mg via INTRAVENOUS
  Filled 2018-03-01: qty 1

## 2018-03-01 MED ORDER — DICYCLOMINE HCL 10 MG/ML IM SOLN
20.0000 mg | Freq: Once | INTRAMUSCULAR | Status: AC
  Administered 2018-03-01: 20 mg via INTRAMUSCULAR
  Filled 2018-03-01: qty 2

## 2018-03-01 MED ORDER — ONDANSETRON 4 MG PO TBDP: 4.0000 mg | ORAL_TABLET | Freq: Four times a day (QID) | ORAL | 0 refills | Status: AC | PRN

## 2018-03-01 MED ORDER — LORAZEPAM 2 MG/ML IJ SOLN
1.0000 mg | Freq: Once | INTRAMUSCULAR | Status: AC
  Administered 2018-03-01: 1 mg via INTRAVENOUS
  Filled 2018-03-01: qty 1

## 2018-03-01 MED ORDER — ONDANSETRON HCL 4 MG/2ML IJ SOLN
4.0000 mg | Freq: Once | INTRAMUSCULAR | Status: AC
Start: 2018-03-01 — End: 2018-03-01
  Administered 2018-03-01: 4 mg via INTRAVENOUS
  Filled 2018-03-01: qty 2

## 2018-03-01 NOTE — ED Triage Notes (Signed)
Pt to triage via EMS for flu like symptoms.  Pt reports chills, generalized body aches, dry mouth, L sided chest pain, non-productive cough, generalized abd pain, nausea, and vomiting since Friday.  Pt thinks she is in withdrawal from xanax and sleeping pills.  Last used on Friday.

## 2018-03-01 NOTE — ED Notes (Signed)
Pt given Ginger Ale and crackers for PO challenge. Will continue to monitor.

## 2018-03-01 NOTE — ED Provider Notes (Signed)
TIME SEEN: 2:26 AM  CHIEF COMPLAINT: Multiple complaints  HPI: Patient is a 38 year old female who presents to the emergency department with complaints of 3 days of mild cough, sore throat, chills, body Aches, abdominal pain, nausea, vomiting.  Denies diarrhea, dysuria, hematuria, vaginal bleeding or discharge.  Reports daughters with similar symptoms.  Did not have a flu shot this year.  No recent travel.  She is also concerned that she could be withdrawing from Xanax.  States she does not have a prescription for this medication.  It appears the last time it was prescribed for her was in June 2019.  States she has not had it in 3 days.  No withdrawal seizures.  States she does take Xanax every day.  History of liposuction and abdominoplasty.  No other abdominal surgery.   ROS: See HPI Constitutional: subjective fever  Eyes: no drainage  ENT: no runny nose   Cardiovascular:  no chest pain  Resp: no SOB  GI: vomiting GU: no dysuria Integumentary: no rash  Allergy: no hives  Musculoskeletal: no leg swelling  Neurological: no slurred speech ROS otherwise negative  PAST MEDICAL HISTORY/PAST SURGICAL HISTORY:  Past Medical History:  Diagnosis Date  . Cigarette smoker   . Diabetes mellitus without complication (HCC)    Gestational diet controlled  . Drug use   . History of abdominoplasty     MEDICATIONS:  Prior to Admission medications   Medication Sig Start Date End Date Taking? Authorizing Provider  ALPRAZolam (XANAX) 0.25 MG tablet Take 1 tablet (0.25 mg total) by mouth at bedtime as needed for anxiety. 06/11/17   Constant, Peggy, MD  buPROPion (WELLBUTRIN XL) 150 MG 24 hr tablet Take 1 tablet (150 mg total) by mouth daily. 05/18/17   Constant, Peggy, MD  cyclobenzaprine (FLEXERIL) 10 MG tablet Take 1 tablet (10 mg total) by mouth every 8 (eight) hours as needed for muscle spasms. Patient not taking: Reported on 05/18/2017 04/21/17   Brock Bad, MD  diphenhydramine-acetaminophen  (TYLENOL PM) 25-500 MG TABS tablet Take 1 tablet by mouth at bedtime as needed (pain, sleep).     [provider]  ibuprofen (ADVIL,MOTRIN) 600 MG tablet Take 1 tablet (600 mg total) by mouth every 6 (six) hours. 04/07/17   Degele, Kandra Nicolas, MD  omeprazole (PRILOSEC) 20 MG capsule Take 1 capsule (20 mg total) by mouth 2 (two) times daily before a meal. Patient not taking: Reported on 05/18/2017 10/30/16   Brock Bad, MD  Prenat w/o A-FeCbn-Meth-FA-DHA (PRENATE MINI) 29-0.6-0.4-350 MG CAPS Take 1 capsule by mouth daily before breakfast. Patient not taking: Reported on 05/18/2017 10/16/16   Brock Bad, MD  sertraline (ZOLOFT) 50 MG tablet Take 1 tablet (50 mg total) by mouth daily. 05/20/17   Constant, Peggy, MD  zolpidem (AMBIEN) 5 MG tablet Take 1 tablet (5 mg total) by mouth at bedtime as needed for sleep. 04/03/17 05/03/17  Dorathy Kinsman, CNM    ALLERGIES:  No Known Allergies  SOCIAL HISTORY:  Social History   Tobacco Use  . Smoking status: Current Some Day Smoker    Types: Cigarettes    Last attempt to quit: 07/05/2014    Years since quitting: 3.6  . Smokeless tobacco: Never Used  Substance Use Topics  . Alcohol use: No    Comment: occasionally    FAMILY HISTORY: Family History  Problem Relation Age of Onset  . Breast cancer Mother   . Diabetes Maternal Grandmother     EXAM: BP 140/77 (  BP Location: Right Arm)   Pulse 64   Temp 97.9 F (36.6 C) (Oral)   Resp (!) 25   LMP 02/08/2018   SpO2 95%  CONSTITUTIONAL: Alert and oriented and responds appropriately to questions.  Patient does not appear toxic.  She is afebrile.  She is moaning in pain and appears anxious. HEAD: Normocephalic EYES: Conjunctivae clear, pupils appear equal, EOMI ENT: normal nose; moist mucous membranes NECK: Supple, no meningismus, no nuchal rigidity, no LAD  CARD: RRR; S1 and S2 appreciated; no murmurs, no clicks, no rubs, no gallops RESP: Normal chest excursion without splinting  or tachypnea; breath sounds clear and equal bilaterally; no wheezes, no rhonchi, no rales, no hypoxia or respiratory distress, speaking full sentences ABD/GI: Normal bowel sounds; non-distended; soft, non-tender, no rebound, no guarding, no peritoneal signs, no hepatosplenomegaly, when distracted her abdominal exam is completely benign BACK:  The back appears normal and is non-tender to palpation, there is no CVA tenderness EXT: Normal ROM in all joints; non-tender to palpation; no edema; normal capillary refill; no cyanosis, no calf tenderness or swelling    SKIN: Normal color for age and race; warm; no rash NEURO: Moves all extremities equally PSYCH: The patient's mood and manner are appropriate. Grooming and personal hygiene are appropriate.  MEDICAL DECISION MAKING: Patient here with complaints of flulike symptoms versus withdrawal.   Labs obtained in triage are unremarkable.  She is complaining of mostly diffuse body aches.  Will check a CK level on patient given her pain seems out of proportion to exam.  Will give IV fluids, Zofran, Toradol and give a dose of IV Ativan for anxiety and possible withdrawal.  She would be outside treatment window for Tamiflu therefore I [name] not feel routine flu testing is necessary at this time.  She reports daughter with similar symptoms at home.  Abdominal exam benign.  Doubt appendicitis, colitis, diverticulitis, bowel obstruction, cholecystitis, pancreatitis.  Urine pending.  ED PROGRESS: Patient CK level is normal.  Urine pending.  She reports feeling better.  No vomiting witnessed in the ED.  Will p.o. challenge.  Complaining of some return of her back pain and abdominal cramping.  Will give IM Bentyl.   Urine shows moderate amount of ketones.  Will give IV fluids.  No sign of infection.  Drug screen positive for benzodiazepines as well as opiates.  She has a documented history of drug use in her chart.  I suspect that her symptoms are secondary to viral  syndrome given daughters with similar symptoms at home.  She has been able to drink here without difficulty.  States she is feeling better.  I feel she is safe for discharge home.   At this time, I [name] not feel there is any life-threatening condition present. I have reviewed and discussed all results (EKG, imaging, lab, urine as appropriate) and exam findings with patient/family. I have reviewed nursing notes and appropriate previous records.  I feel the patient is safe to be discharged home without further emergent workup and can continue workup as an outpatient as needed. Discussed usual and customary return precautions. Patient/family verbalize understanding and are comfortable with this plan.  Outpatient follow-up has been provided as needed. All questions have been answered.     [name], Layla Maw, [name] 03/01/18 [id number]

## 2018-03-01 NOTE — Discharge Instructions (Signed)
You may alternate Tylenol 1000 mg every 6 hours as needed for fever and pain and ibuprofen 800 mg every 8 hours as needed for fever and pain. Please rest and drink plenty of fluids. This is a viral illness causing your symptoms. You do not need antibiotics for a virus. You may use over-the-counter nasal saline spray and Afrin nasal saline spray as needed for nasal congestion. Please do not use Afrin for more than 3 days in a row. You may use guaifenesin and dextromethorphan as needed for cough.  You may use lozenges and Chloraseptic spray to help with sore throat.  Warm salt water gargles can also help with sore throat.  You may use over-the-counter Unisom (doxyalamine) or Benadryl to help with sleep.  Please note that some combination medicines such as DayQuil and NyQuil have multiple medications in them.  Please make sure you look at all labels to ensure that you are not taking too much of any one particular medication.  Symptoms from a virus may take 7-14 days to run its course. ° °We do not test for the flu from the emergency department as it takes hours for this test to come back and it would not change our management. The flu is treated like any other virus with supportive measures as listed above. At this time you are outside the treatment window for Tamiflu. Tamiflu has to be taken within the first 48 hours of symptoms.  Tamiflu has many side effects including nausea, vomiting and diarrhea. ° ° ° °Steps to find a Primary Care Provider (PCP): ° °Call [phone] or [phone] to access "[facility] Find a Doctor Service." ° °2.  You may also go on the [facility] website at [email or web address] ° °3.  [facility] also frequently accepts new patients. ° °[facility]  °[address] °[place] [address] °[phone] ° °4.  There are also multiple [facility], [facility] and [facility] practices throughout the [place] that  are frequently accepting new patients. You may find a clinic that is close to your home and contact them. ° °[facility] °[email or web address] °[phone] ° °[facility] °[email or web address] ° °[facility] °[email or web address] °[phone] ° °[facility] °[email or web address] °[phone] ° °5.  Local Health Departments also can provide primary care services. ° °[facility]  °[address] °[address] [place] [address] °[phone] ° °[facility] °[address] °[place] [address] °[phone] ° °[facility] °[address]  °[place]  [address] °[phone] ° ° ° ° ° ° °

## 2018-03-05 ENCOUNTER — Other Ambulatory Visit: Payer: Self-pay | Admitting: Advanced Practice Midwife

## 2018-03-05 DIAGNOSIS — F5101 Primary insomnia: Secondary | ICD-10-CM

## 2018-03-05 DIAGNOSIS — G4701 Insomnia due to medical condition: Secondary | ICD-10-CM

## 2018-03-05 NOTE — Telephone Encounter (Signed)
Called patient  - advised she will need to obtain medication refill from her PCP. Patient stated she understood and had no further questions.

## 2018-03-08 NOTE — Telephone Encounter (Signed)
Agree that this needs to be managed by PCP.

## 2018-10-13 IMAGING — US US MFM FETAL BPP W/O NON-STRESS
1 series · 15 of 22 positions shown · non-contrast
Comparison: none

[Series 1: us mfm fetal bpp w/o non-stress · 22 acquisitions, 15 frames shown]
[im 1/22]
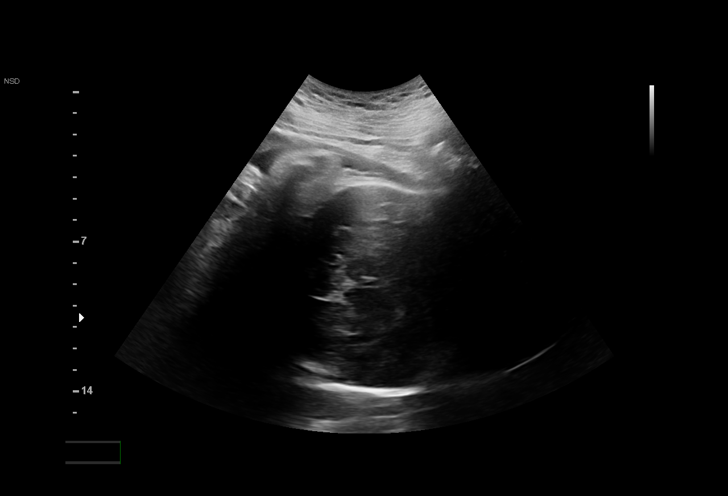
[im 3/22]
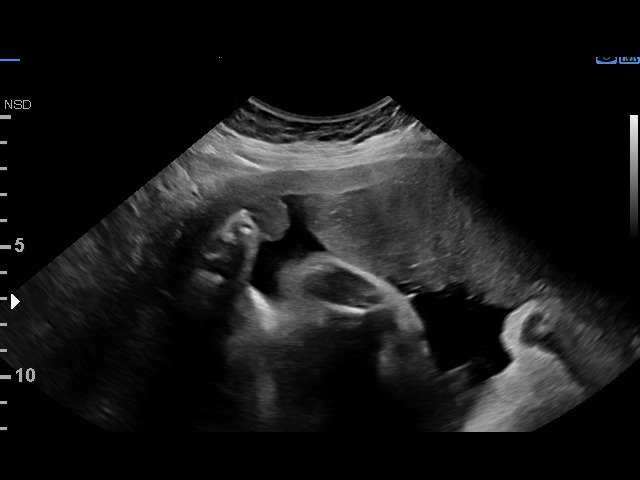
[im 4/22]
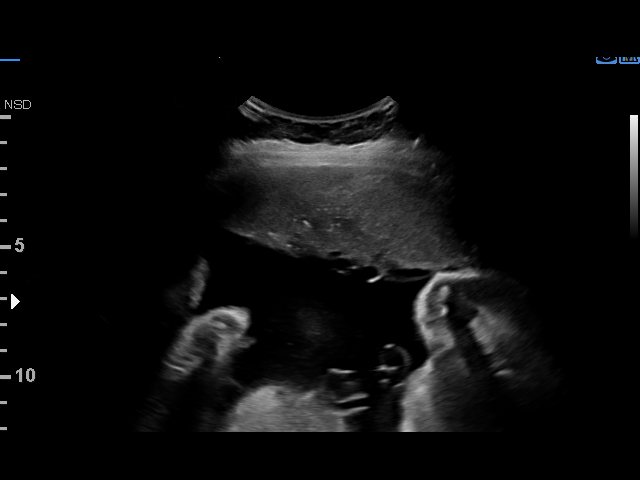
[im 6/22]
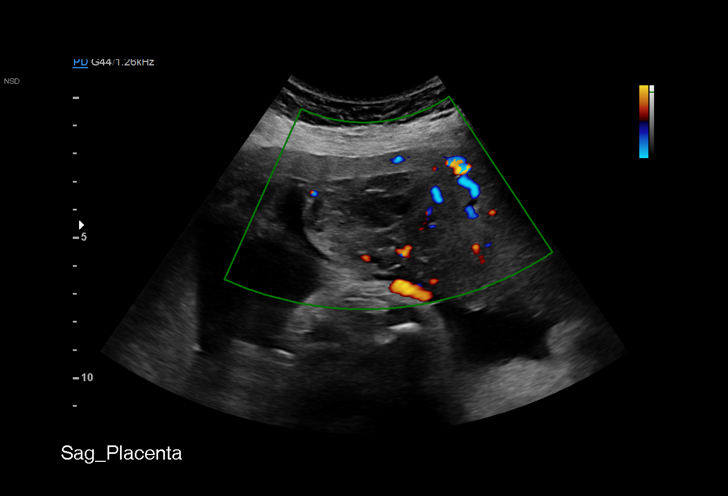
[im 7/22]
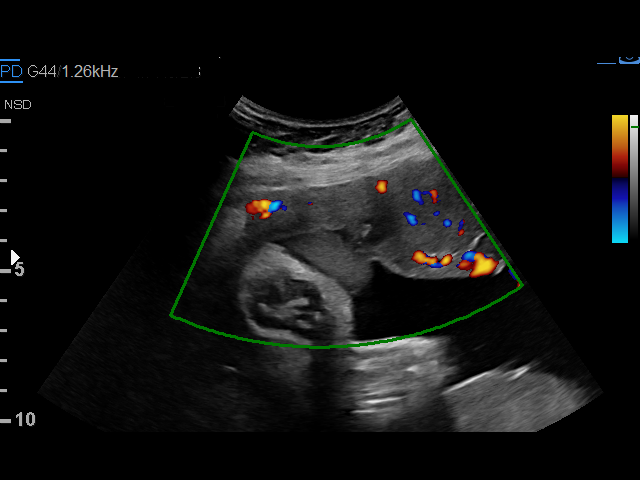
[im 9/22]
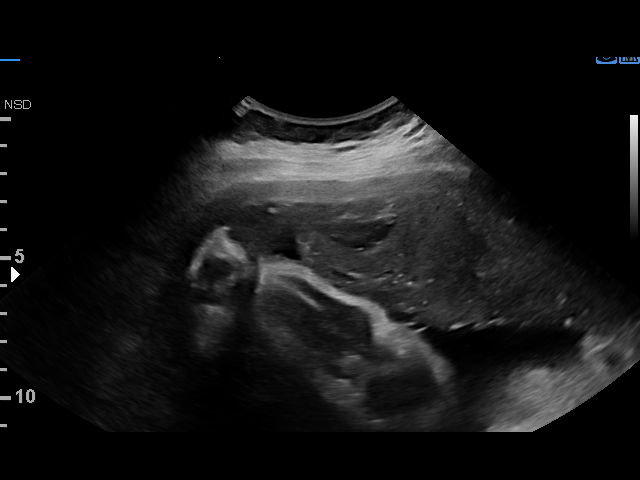
[im 10/22]
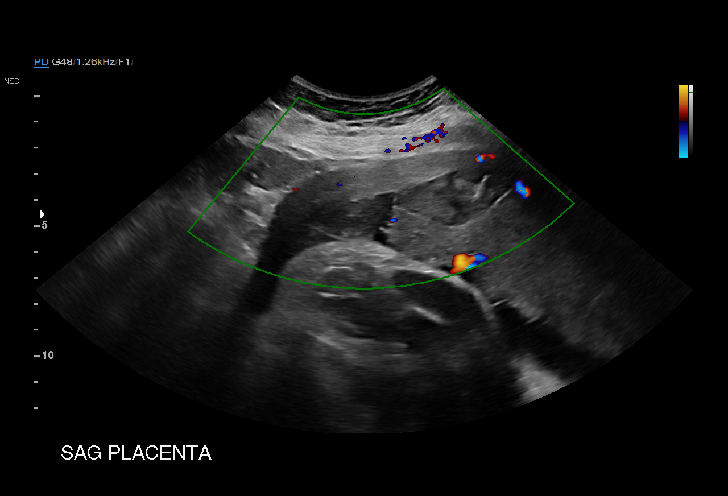
[im 12/22]
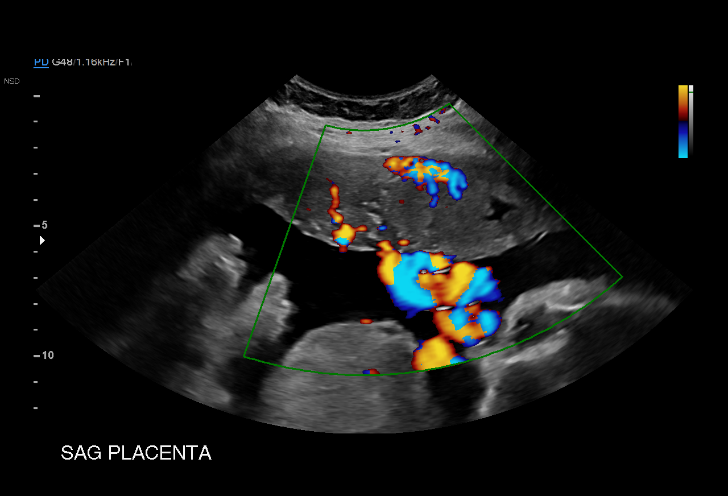
[im 13/22]
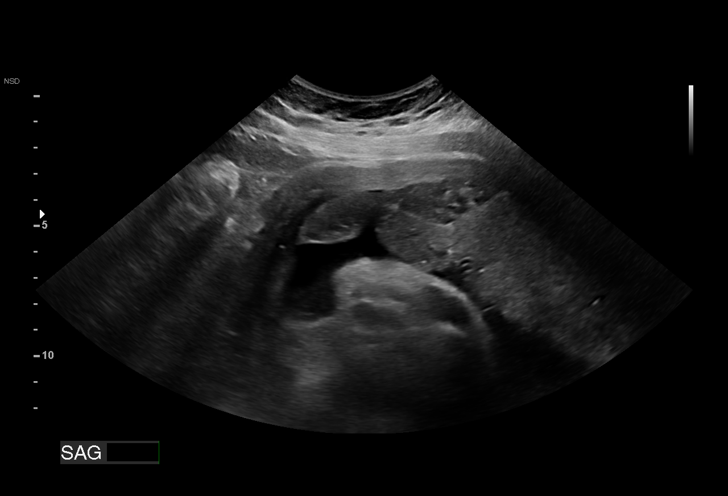
[im 14/22]
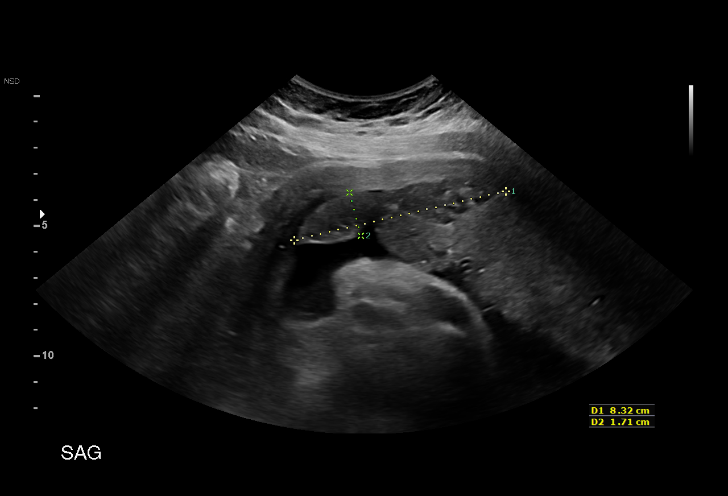
[im 16/22]
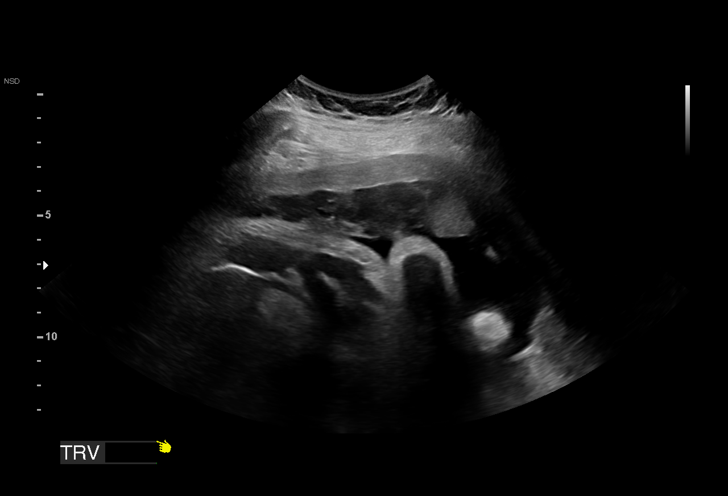
[im 17/22]
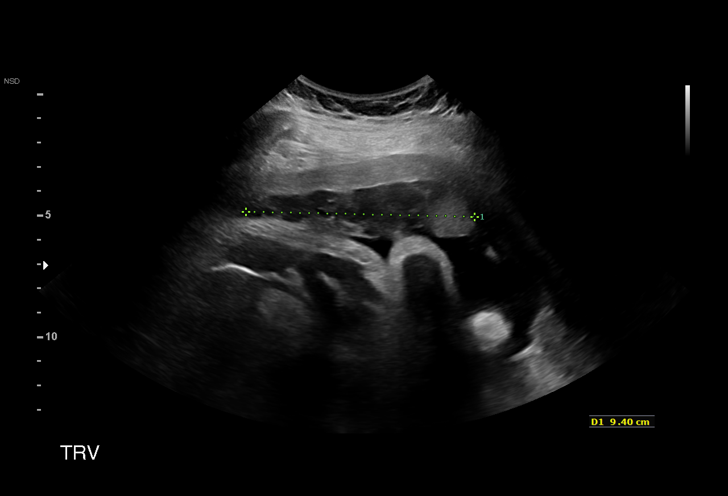
[im 19/22]
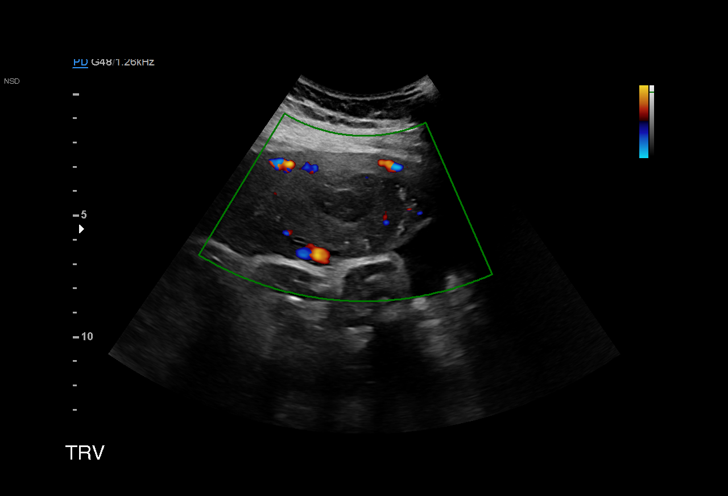
[im 20/22]
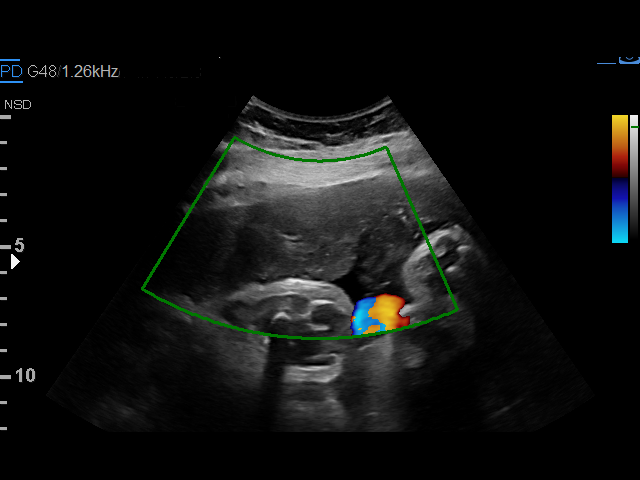
[im 22/22]
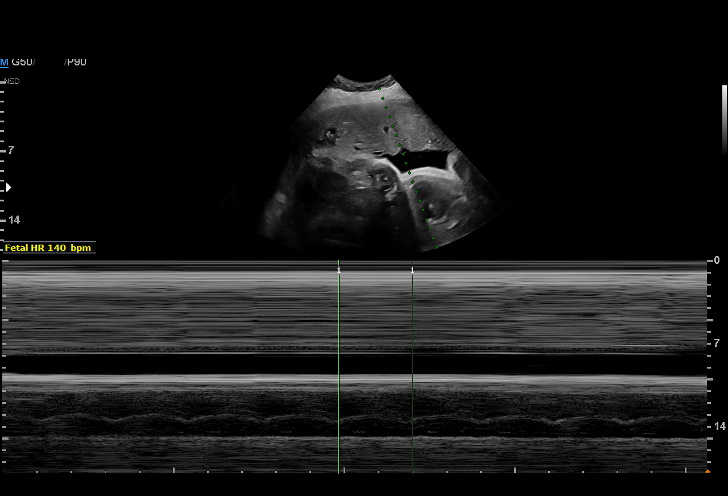

[15 of 22 positions shown; findings below may reference images not displayed]

Attending:        Djjang Embalo         Secondary Phy.:    [facility]-
MAU/Triage

1  NOLA OXENDINE           [id number]      [id number]     [id number]
Indications

37 weeks gestation of pregnancy
Decreased fetal movements, third trimester,
unspecified
OB History

Gravidity:    6         Term:   3         Prem:  0         SAB:   1
TOP:          1       Ectopic:  0        Living: 3
Fetal Evaluation

Num Of Fetuses:     1
Fetal Heart         153
Rate(bpm):
Cardiac Activity:   Observed
Presentation:       Cephalic
Placenta:           Left lateral, above cervical os
P. Cord Insertion:  Previously Visualized

Amniotic Fluid
AFI FV:      Subjectively within normal limits

AFI Sum(cm)     %Tile       Largest Pocket(cm)
18.94           73

RUQ(cm)       RLQ(cm)       LUQ(cm)        LLQ(cm)
4.08
Comment:    Moderate collection seen at superior margin of placenta
measures 8.3 x 1.7 x 9.4cm.
Biophysical Evaluation

Amniotic F.V:   Pocket => 2 cm two          F. Tone:        Observed
planes
F. Movement:    Not Observed                Score:          [DATE]
F. Breathing:   Not Observed
Gestational Age

LMP:           38w 1d        Date:  07/11/16                 EDD:    04/17/17
Best:          37w 2d    Det. By:   Early Ultrasound         EDD:    04/23/17
(09/09/16)
Impression

Single living intrauterine pregnancy at 37w 2d.
Cephalic presentation.
Left lateral placenta with heterogeneous collection at the
superior margin of placenta measuring 8.3 x 1.7 x 9.4cm.
Concerning for placental abruption.
Normal amniotic fluid volume.
BPP [DATE].
Recommendations

Given BPP [DATE] and concern for abruption, recommend
delivery.
Preliminary results reported to Rtoyota Joshjax, CNM.
Final report discussed with Dr. Ogirima who will proceed
with delivery.

## 2018-12-10 ENCOUNTER — Other Ambulatory Visit: Payer: Self-pay | Admitting: Obstetrics

## 2018-12-10 DIAGNOSIS — M545 Low back pain, unspecified: Secondary | ICD-10-CM

## 2019-05-24 ENCOUNTER — Ambulatory Visit: Payer: Self-pay
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
## 2019-06-14 ENCOUNTER — Encounter (HOSPITAL_BASED_OUTPATIENT_CLINIC_OR_DEPARTMENT_OTHER): Payer: Self-pay

## 2019-06-14 ENCOUNTER — Emergency Department (HOSPITAL_BASED_OUTPATIENT_CLINIC_OR_DEPARTMENT_OTHER)
Admission: EM | Admit: 2019-06-14 | Discharge: 2019-06-14 | Disposition: A | Discharge status: Home / Self Care | Payer: Medicaid Other | Attending: Emergency Medicine | Admitting: Emergency Medicine

## 2019-06-14 ENCOUNTER — Other Ambulatory Visit: Payer: Self-pay

## 2019-06-14 DIAGNOSIS — Z87891 Personal history of nicotine dependence: Secondary | ICD-10-CM | POA: Insufficient documentation

## 2019-06-14 DIAGNOSIS — J029 Acute pharyngitis, unspecified: Secondary | ICD-10-CM | POA: Diagnosis not present

## 2019-06-14 DIAGNOSIS — R070 Pain in throat: Secondary | ICD-10-CM | POA: Diagnosis present

## 2019-06-14 DIAGNOSIS — E109 Type 1 diabetes mellitus without complications: Secondary | ICD-10-CM | POA: Diagnosis not present

## 2019-06-14 LAB — GROUP A STREP BY PCR: Group A Strep by PCR: NOT DETECTED

## 2019-06-14 MED ORDER — OXYCODONE-ACETAMINOPHEN 5-325 MG PO TABS: 1.0000 | ORAL_TABLET | Freq: Four times a day (QID) | ORAL | 0 refills | Status: AC | PRN

## 2019-06-14 MED ORDER — DEXAMETHASONE SODIUM PHOSPHATE 10 MG/ML IJ SOLN
10.0000 mg | Freq: Once | INTRAMUSCULAR | Status: AC
  Administered 2019-06-14: 10 mg via INTRAMUSCULAR
  Filled 2019-06-14: qty 1

## 2019-06-14 NOTE — ED Triage Notes (Signed)
Pt c/o sore throat/fever day 2-denies cough-NAD-steady gait

## 2019-06-14 NOTE — ED Provider Notes (Signed)
Emergency Department Provider Note   I have reviewed the triage vital signs and the nursing notes.   HISTORY  Chief Complaint Sore Throat   HPI [name] is a 39 y.o. female with past medical history of diabetes presents to the emergency department with sore throat.  Symptoms began  2 days ago.  Patient had a fever yesterday but not today.  She denies any shortness of breath.  She has severe pain with any swallowing but is able to swallow solids and liquids.  She is not having chest discomfort.  She is feeling some swelling and tenderness in her neck as well.  She has not been vaccinated against COVID-19 but has been tested in the last 7 days which was negative. No radiation of symptoms or modifying factors.    Past Medical History:  Diagnosis Date  . Diabetes mellitus without complication ([place])    Gestational diet controlled  . Drug use   . History of abdominoplasty     Patient Active Problem List   Diagnosis Date Noted  . Placental abruption in third trimester 04/04/2017  . BPP 6/10, antepartum 04/04/2017  . S/P urgent cesarean section and BTL 04/04/2017  . History of abdominoplasty 03/23/2017  . Gestational diabetes mellitus in childbirth, uncontrolled 02/04/2017  . Rubella non-immune status, antepartum 10/26/2016  . Supervision of high risk pregnancy, antepartum 10/16/2016  . Low grade squamous intraepithelial lesion (LGSIL) on Papanicolaou smear of cervix 12/22/2014    Past Surgical History:  Procedure Laterality Date  . CESAREAN SECTION N/A 04/04/2017   Procedure: CESAREAN SECTION;  Surgeon: Osborne Oman, MD;  Location: [facility];  Service: Obstetrics;  Laterality: N/A;  . DILATION AND CURETTAGE OF UTERUS N/A 07/06/2015   Procedure: DILATATION AND Evacuation;  Surgeon: Osborne Oman, MD;  Location: [facility];  Service: Gynecology;  Laterality: N/A;  . TUBAL LIGATION Bilateral 04/04/2017   Procedure: BILATERAL TUBAL LIGATION;  Surgeon: Osborne Oman, MD;  Location: [facility];  Service: Obstetrics;  Laterality: Bilateral;  . tummy tuck      Allergies Patient has no known allergies.  Family History  Problem Relation Age of Onset  . Breast cancer Mother   . Diabetes Maternal Grandmother     Social History Social History   Tobacco Use  . Smoking status: Former Smoker    Types: Cigarettes    Quit date: 07/05/2014    Years since quitting: 4.9  . Smokeless tobacco: Never Used  Substance Use Topics  . Alcohol use: Yes    Comment: occ  . Drug use: Not Currently    Review of Systems  Constitutional: No fever/chills Eyes: No visual changes. ENT: Positive sore throat. Cardiovascular: Denies chest pain. Respiratory: Denies shortness of breath.   Musculoskeletal: Negative for back pain. Skin: Negative for rash. Neurological: Negative for headaches.  10-point ROS otherwise negative.  ____________________________________________   PHYSICAL EXAM:  VITAL SIGNS: ED Triage Vitals  Enc Vitals Group     BP 06/14/19 2034 (!) 107/46     Pulse Rate 06/14/19 2034 (!) 106     Resp 06/14/19 2034 19     Temp 06/14/19 2034 98.4 F (36.9 C)     Temp src --      SpO2 06/14/19 2034 99 %     Weight 06/14/19 2034 165 lb (74.8 kg)     Height 06/14/19 2034 5\' 3"  (1.6 m)   Constitutional: Alert and oriented. Well appearing and in no acute distress. Eyes:  Conjunctivae are normal.  Head: Atraumatic. Nose: No congestion/rhinnorhea. Mouth/Throat: Mucous membranes are moist. Posterior oropharynx with erythema. No masses or abscess. No PTA. Soft submandibular compartment with scattered lymphadenopathy. Managing oral secretions and speaking in a clear voice.  Neck: No stridor.  Cardiovascular: Normal rate, regular rhythm. Good peripheral circulation. Grossly normal heart sounds.   Respiratory: Normal respiratory effort.  No retractions. Lungs CTAB. Gastrointestinal: No distention.  Musculoskeletal:  No gross deformities  of extremities. Neurologic:  Normal speech and language.  Skin:  Skin is warm, dry and intact. No rash noted.  ____________________________________________   LABS (all labs ordered are listed, but only abnormal results are displayed)  Labs Reviewed  GROUP A STREP BY PCR   ____________________________________________  RADIOLOGY  None ____________________________________________   PROCEDURES  Procedure(s) performed:   Procedures  None ____________________________________________   INITIAL IMPRESSION / ASSESSMENT AND PLAN / ED COURSE  Pertinent labs & imaging results that were available during my care of the patient were reviewed by me and considered in my medical decision making (see chart for details).   Patient presents to the emergency department with fever yesterday and continued sore throat.  On exam I do not visualize any peritonsillar abscess.  She has an erythematous oropharynx. Strep PCR negative. Patient tested for COVID in the last week so will not repeat here. No other covid like symptoms at this time. Plan for decadron and short pain med course. Reviewed with [facility] drug database prior to Rx. Discussed side effects and short duration of pain meds. Discussed ED return precautions.    ____________________________________________  FINAL CLINICAL IMPRESSION(S) / ED DIAGNOSES  Final diagnoses:  Viral pharyngitis     MEDICATIONS GIVEN DURING THIS VISIT:  Medications  dexamethasone (DECADRON) injection 10 mg (10 mg Intramuscular Given 06/14/19 2154)     NEW OUTPATIENT MEDICATIONS STARTED DURING THIS VISIT:  Discharge Medication List as of 06/14/2019  9:56 PM    START taking these medications   Details  oxyCODONE-acetaminophen (PERCOCET/ROXICET) 5-325 MG tablet Take 1 tablet by mouth every 6 (six) hours as needed for severe pain., Starting Tue 06/14/2019, Normal        Note:  This document was prepared using Dragon voice recognition software and may include  unintentional dictation errors.  Alona Bene, MD, [facility]    Desirea Mizrahi, Arlyss Repress, MD 06/15/19 [phone]

## 2019-06-14 NOTE — Discharge Instructions (Signed)
You were seen in the emergency department today with sore throat.  Your strep test was negative and I am starting you on steroid which was given here.  This will last over the next several days and does not need repeat dosing.  Your symptoms should improve over that time.  I have provided a prescription for Percocet to take only as needed for severe pain.  It can cause drowsiness and you cannot take it with Xanax, alcohol, pain medicines.  You cannot drive a car while taking this medicine.  Please follow closely with your primary care doctor and return to the emergency department any new or suddenly worsening symptoms.

## 2019-08-10 ENCOUNTER — Emergency Department (HOSPITAL_BASED_OUTPATIENT_CLINIC_OR_DEPARTMENT_OTHER): Payer: Medicaid Other

## 2019-08-10 ENCOUNTER — Encounter (HOSPITAL_BASED_OUTPATIENT_CLINIC_OR_DEPARTMENT_OTHER): Payer: Self-pay | Admitting: *Deleted

## 2019-08-10 ENCOUNTER — Other Ambulatory Visit: Payer: Self-pay

## 2019-08-10 ENCOUNTER — Emergency Department (HOSPITAL_BASED_OUTPATIENT_CLINIC_OR_DEPARTMENT_OTHER)
Admission: EM | Admit: 2019-08-10 | Discharge: 2019-08-10 | Disposition: A | Discharge status: Home / Self Care | Payer: Medicaid Other | Attending: Emergency Medicine | Admitting: Emergency Medicine

## 2019-08-10 DIAGNOSIS — E119 Type 2 diabetes mellitus without complications: Secondary | ICD-10-CM | POA: Insufficient documentation

## 2019-08-10 DIAGNOSIS — Y929 Unspecified place or not applicable: Secondary | ICD-10-CM | POA: Diagnosis not present

## 2019-08-10 DIAGNOSIS — S0285XA Fracture of orbit, unspecified, initial encounter for closed fracture: Secondary | ICD-10-CM

## 2019-08-10 DIAGNOSIS — Z79899 Other long term (current) drug therapy: Secondary | ICD-10-CM | POA: Insufficient documentation

## 2019-08-10 DIAGNOSIS — Y999 Unspecified external cause status: Secondary | ICD-10-CM | POA: Diagnosis not present

## 2019-08-10 DIAGNOSIS — R519 Headache, unspecified: Secondary | ICD-10-CM | POA: Insufficient documentation

## 2019-08-10 DIAGNOSIS — S0231XA Fracture of orbital floor, right side, initial encounter for closed fracture: Secondary | ICD-10-CM | POA: Insufficient documentation

## 2019-08-10 DIAGNOSIS — Z87891 Personal history of nicotine dependence: Secondary | ICD-10-CM | POA: Diagnosis not present

## 2019-08-10 DIAGNOSIS — Y9289 Other specified places as the place of occurrence of the external cause: Secondary | ICD-10-CM | POA: Insufficient documentation

## 2019-08-10 DIAGNOSIS — Y939 Activity, unspecified: Secondary | ICD-10-CM | POA: Insufficient documentation

## 2019-08-10 MED ORDER — ERYTHROMYCIN 5 MG/GM OP OINT
TOPICAL_OINTMENT | Freq: Once | OPHTHALMIC | Status: AC
  Administered 2019-08-10: 1 via OPHTHALMIC
  Filled 2019-08-10: qty 3.5

## 2019-08-10 MED ORDER — HYDROCODONE-ACETAMINOPHEN 5-325 MG PO TABS: 1.0000 | ORAL_TABLET | Freq: Four times a day (QID) | ORAL | 0 refills | Status: AC | PRN

## 2019-08-10 MED ORDER — ERYTHROMYCIN 5 MG/GM OP OINT: TOPICAL_OINTMENT | OPHTHALMIC | 0 refills | Status: AC
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
## 2019-08-10 NOTE — Discharge Instructions (Signed)
I am prescribing you a short course of Vicodin.  This is a narcotic medication.  It can be constipating.  Do not mix with alcohol.  I am also prescribing you erythromycin.  This is an antibiotic to apply to your right eye.  Please do this 3-4 times per day.  You have been given contact information for an ENT in the area.  Please follow-up with them to schedule an appointment if you feel your symptoms are not improved.  Please return to the ER with new or worsening symptoms.  It was a pleasure to meet you.
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
## 2020-06-24 ENCOUNTER — Encounter (HOSPITAL_BASED_OUTPATIENT_CLINIC_OR_DEPARTMENT_OTHER): Payer: Self-pay

## 2020-06-24 ENCOUNTER — Emergency Department (HOSPITAL_BASED_OUTPATIENT_CLINIC_OR_DEPARTMENT_OTHER): Payer: Medicaid Other | Admitting: Radiology

## 2020-06-24 ENCOUNTER — Other Ambulatory Visit: Payer: Self-pay

## 2020-06-24 ENCOUNTER — Emergency Department (HOSPITAL_BASED_OUTPATIENT_CLINIC_OR_DEPARTMENT_OTHER)
Admission: EM | Admit: 2020-06-24 | Discharge: 2020-06-24 | Disposition: A | Discharge status: Home / Self Care | Payer: Medicaid Other | Attending: Emergency Medicine | Admitting: Emergency Medicine

## 2020-06-24 DIAGNOSIS — W01198A Fall on same level from slipping, tripping and stumbling with subsequent striking against other object, initial encounter: Secondary | ICD-10-CM | POA: Insufficient documentation

## 2020-06-24 DIAGNOSIS — M25552 Pain in left hip: Secondary | ICD-10-CM | POA: Diagnosis present

## 2020-06-24 DIAGNOSIS — Y92511 Restaurant or cafe as the place of occurrence of the external cause: Secondary | ICD-10-CM | POA: Insufficient documentation

## 2020-06-24 DIAGNOSIS — Z87891 Personal history of nicotine dependence: Secondary | ICD-10-CM | POA: Diagnosis not present

## 2020-06-24 DIAGNOSIS — M5416 Radiculopathy, lumbar region: Secondary | ICD-10-CM

## 2020-06-24 DIAGNOSIS — E119 Type 2 diabetes mellitus without complications: Secondary | ICD-10-CM | POA: Insufficient documentation

## 2020-06-24 LAB — PREGNANCY, URINE: Preg Test, Ur: NEGATIVE

## 2020-06-24 MED ORDER — OXYCODONE HCL 5 MG PO TABS
5.0000 mg | ORAL_TABLET | Freq: Once | ORAL | Status: AC
  Administered 2020-06-24: 5 mg via ORAL
  Filled 2020-06-24: qty 1

## 2020-06-24 MED ORDER — PREDNISONE 10 MG PO TABS: 40.0000 mg | ORAL_TABLET | Freq: Every day | ORAL | 0 refills | Status: AC

## 2020-06-24 MED ORDER — KETOROLAC TROMETHAMINE 60 MG/2ML IM SOLN
60.0000 mg | Freq: Once | INTRAMUSCULAR | Status: AC
  Administered 2020-06-24: 60 mg via INTRAMUSCULAR
  Filled 2020-06-24: qty 2

## 2020-06-24 MED ORDER — CYCLOBENZAPRINE HCL 10 MG PO TABS: 10.0000 mg | ORAL_TABLET | Freq: Two times a day (BID) | ORAL | 0 refills | Status: AC | PRN

## 2020-06-24 NOTE — Discharge Instructions (Addendum)
Take Tylenol (acetaminophen) 1000 mg 4 times a day for 1 week. This is the maximum dose of Tylenol you can take from all sources. Please check other over-the-counter medications and prescriptions to ensure you are not taking other medications that contain acetaminophen.  You may also take ibuprofen 400 mg 6 times a day alternating with or at the same time as tylenol.

## 2020-06-24 NOTE — ED Triage Notes (Signed)
C/o hip pain and tingling and burning sensation to left leg
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
## 2021-04-18 ENCOUNTER — Ambulatory Visit: Payer: Medicaid Other | Admitting: Obstetrics and Gynecology

## 2021-05-29 ENCOUNTER — Telehealth: Payer: Self-pay

## 2021-05-29 ENCOUNTER — Ambulatory Visit (INDEPENDENT_AMBULATORY_CARE_PROVIDER_SITE_OTHER): Payer: Medicaid Other | Admitting: Obstetrics and Gynecology

## 2021-05-29 ENCOUNTER — Encounter: Payer: Self-pay | Admitting: Obstetrics and Gynecology

## 2021-05-29 ENCOUNTER — Other Ambulatory Visit (HOSPITAL_COMMUNITY)
Admission: RE | Admit: 2021-05-29 | Discharge: 2021-05-29 | Disposition: A | Discharge status: Home / Self Care | Payer: Medicaid Other | Source: Ambulatory Visit | Attending: Obstetrics and Gynecology | Admitting: Obstetrics and Gynecology

## 2021-05-29 VITALS — BP 111/79 | HR 90 | Ht 64.0 in | Wt 199.0 lb

## 2021-05-29 DIAGNOSIS — Z803 Family history of malignant neoplasm of breast: Secondary | ICD-10-CM | POA: Diagnosis not present

## 2021-05-29 DIAGNOSIS — Z01419 Encounter for gynecological examination (general) (routine) without abnormal findings: Secondary | ICD-10-CM

## 2021-05-29 NOTE — Progress Notes (Signed)
GYNECOLOGY ANNUAL PREVENTATIVE CARE ENCOUNTER NOTE  History:     [name] is a 41 y.o. [phone] female here for a routine annual gynecologic exam.  Current complaints: intermittent, rare left pelvic pain.  The pain is sharp in nature not affected by food, but sometimes worse with intercourse.  No symptoms for at least one month.   Denies abnormal vaginal bleeding, discharge, problems with intercourse or other gynecologic concerns.    Gynecologic History Patient's last menstrual period was 04/18/2021. Contraception: tubal ligation Last Pap: 10/2016. Results were: ASCUS with negative HPV Last mammogram: n/a  Obstetric History OB History  Gravida Para Term Preterm AB Living  5 4 4   1 4   SAB IAB Ectopic Multiple Live Births  1 0 0 0 4    # Outcome Date GA Lbr Len/2nd Weight Sex Delivery Anes PTL Lv  5 Term 04/04/17 [redacted]w[redacted]d  5 lb 14 oz (2.665 kg) M CS-LTranv Spinal  LIV  4 SAB           3 Term           2 Term           1 Term             Past Medical History:  Diagnosis Date   Diabetes mellitus without complication (HCC)    Gestational diet controlled   Drug use    History of abdominoplasty     Past Surgical History:  Procedure Laterality Date   CESAREAN SECTION N/A 04/04/2017   Procedure: CESAREAN SECTION;  Surgeon: 04/06/2017, MD;  Location: [facility];  Service: Obstetrics;  Laterality: N/A;   DILATION AND CURETTAGE OF UTERUS N/A 07/06/2015   Procedure: DILATATION AND Evacuation;  Surgeon: 07/08/2015, MD;  Location: [facility];  Service: Gynecology;  Laterality: N/A;   TUBAL LIGATION Bilateral 04/04/2017   Procedure: BILATERAL TUBAL LIGATION;  Surgeon: 04/06/2017, MD;  Location: [facility];  Service: Obstetrics;  Laterality: Bilateral;   tummy tuck      Current Outpatient Medications on File Prior to Visit  Medication Sig Dispense Refill   ALPRAZolam (XANAX) 0.25 MG tablet Take 1 tablet (0.25 mg total) by mouth at bedtime as  needed for anxiety. (Patient not taking: Reported on 05/29/2021) 30 tablet 0   cyclobenzaprine (FLEXERIL) 10 MG tablet Take 1 tablet (10 mg total) by mouth 2 (two) times daily as needed for muscle spasms. (Patient not taking: Reported on 05/29/2021) 20 tablet 0   dicyclomine (BENTYL) 20 MG tablet Take 1 tablet (20 mg total) by mouth every 8 (eight) hours as needed for spasms (Abdominal cramping). (Patient not taking: Reported on 05/29/2021) 15 tablet 0   erythromycin ophthalmic ointment Place a 1/2 inch ribbon of ointment into the lower eyelid. (Patient not taking: Reported on 05/29/2021) 3.5 g 0   HYDROcodone-acetaminophen (NORCO/VICODIN) 5-325 MG tablet Take 1 tablet by mouth every 6 (six) hours as needed. (Patient not taking: Reported on 05/29/2021) 5 tablet 0   mirtazapine (REMERON) 15 MG tablet TAKE 1/2 TABLET BY MOUTH EVERY NIGHT AT BEDTIME FOR 2 WEEKS THEN TAKE 1 TABLET BY MOUTH EVERY NIGHT AT BEDTIME (Patient not taking: Reported on 05/29/2021)     ondansetron (ZOFRAN ODT) 4 MG disintegrating tablet Take 1 tablet (4 mg total) by mouth every 6 (six) hours as needed. (Patient not taking: Reported on 05/29/2021) 20 tablet 0   oxyCODONE-acetaminophen (PERCOCET/ROXICET) 5-325 MG tablet Take 1 tablet by mouth every  6 (six) hours as needed for severe pain. (Patient not taking: Reported on 05/29/2021) 6 tablet 0   Prenat w/o A-FeCbn-Meth-FA-DHA (PRENATE MINI) 29-0.6-0.4-350 MG CAPS Take 1 capsule by mouth daily before breakfast. (Patient not taking: Reported on 05/18/2017) 90 capsule 4   zolpidem (AMBIEN) 5 MG tablet Take 1 tablet (5 mg total) by mouth at bedtime as needed for sleep. (Patient not taking: Reported on 05/29/2021) 30 tablet 2   No current facility-administered medications on file prior to visit.    No Known Allergies  Social History:  reports that she quit smoking about 6 years ago. Her smoking use included cigarettes. She uses smokeless tobacco. She reports current alcohol use. She reports that  she does not currently use drugs.  Family History  Problem Relation Age of Onset   Breast cancer Mother    Diabetes Maternal Grandmother     The following portions of the patient's history were reviewed and updated as appropriate: allergies, current medications, past family history, past medical history, past social history, past surgical history and problem list.  Review of Systems Pertinent items noted in HPI and remainder of comprehensive ROS otherwise negative.  Physical Exam:  BP 111/79   Pulse 90   Ht 5\' 4"  (1.626 m)   Wt 199 lb (90.3 kg)   LMP 04/18/2021 Comment: Tubal  BMI 34.16 kg/m  CONSTITUTIONAL: Well-developed, well-nourished female in no acute distress.  HENT:  Normocephalic, atraumatic, External right and left ear normal. Oropharynx is clear and moist EYES: Conjunctivae and EOM are normal.  NECK: Normal range of motion, supple, no masses.  Normal thyroid.  SKIN: Skin is warm and dry. No rash noted. Not diaphoretic. No erythema. No pallor. MUSCULOSKELETAL: Normal range of motion. No tenderness.  No cyanosis, clubbing, or edema.  2+ distal pulses. NEUROLOGIC: Alert and oriented to person, place, and time. Normal reflexes, muscle tone coordination.  PSYCHIATRIC: Normal mood and affect. Normal behavior. Normal judgment and thought content. CARDIOVASCULAR: Normal heart rate noted, regular rhythm RESPIRATORY: Clear to auscultation bilaterally. Effort and breath sounds normal, no problems with respiration noted. BREASTS: Symmetric in size. No masses, tenderness, skin changes, nipple drainage, or lymphadenopathy bilaterally. Performed in the presence of a chaperone. ABDOMEN: Soft, no distention noted.  No tenderness, rebound or guarding.  PELVIC: Normal appearing external genitalia and urethral meatus; normal appearing vaginal mucosa and cervix.  No abnormal discharge noted.  Pap smear obtained.  Normal uterine size, no other palpable masses, no uterine or adnexal tenderness.   Performed in the presence of a chaperone.   Assessment and Plan:    1. Women's annual routine gynecological examination Normal annual exam, pt informed ovarian cyst is a probable cause of pain along with other sources, but since it has resolved and pain is not currently present on exam will follow expectant management.  If pain returns will assess with pelvic u/s - Cytology - PAP( )  2. Family history of breast cancer in first degree relative Pt notes mother diagnosed at age 97 previously, will start screening mammograms now - MM 3D SCREEN BREAST BILATERAL; Future  Will follow up results of pap smear and manage accordingly. Mammogram scheduled Routine preventative health maintenance measures emphasized. Please refer to After Visit Summary for other counseling recommendations.      56, MD, FACOG Obstetrician & Gynecologist, [facility] for [facility], [facility]

## 2021-05-29 NOTE — Progress Notes (Signed)
41 y.o GYN presents for AEX/PAP/STD screening.  Reports no problems today.

## 2021-05-31 LAB — CYTOLOGY - PAP
Comment: NEGATIVE
Diagnosis: NEGATIVE
High risk HPV: NEGATIVE

## 2021-06-07 ENCOUNTER — Ambulatory Visit: Payer: Medicaid Other

## 2021-06-13 ENCOUNTER — Ambulatory Visit: Payer: Medicaid Other

## 2021-11-12 NOTE — Telephone Encounter (Signed)
informed PT of scheduled mammo.
